# Patient Record
Sex: Female | Born: 1955 | Race: White | Hispanic: No | Marital: Married | Smoking: Former smoker
Health system: Southern US, Community
[De-identification: ages and names within clinical notes are randomized; demographics above are authoritative.]

## PROBLEM LIST (undated history)

## (undated) DIAGNOSIS — M1990 Unspecified osteoarthritis, unspecified site: Secondary | ICD-10-CM

## (undated) DIAGNOSIS — F32A Depression, unspecified: Secondary | ICD-10-CM

## (undated) DIAGNOSIS — IMO0002 Reserved for concepts with insufficient information to code with codable children: Secondary | ICD-10-CM

## (undated) DIAGNOSIS — F410 Panic disorder [episodic paroxysmal anxiety] without agoraphobia: Secondary | ICD-10-CM

## (undated) DIAGNOSIS — F329 Major depressive disorder, single episode, unspecified: Secondary | ICD-10-CM

## (undated) DIAGNOSIS — J449 Chronic obstructive pulmonary disease, unspecified: Secondary | ICD-10-CM

## (undated) HISTORY — DX: Unspecified osteoarthritis, unspecified site: M19.90

## (undated) HISTORY — DX: Depression, unspecified: F32.A

## (undated) HISTORY — DX: Reserved for concepts with insufficient information to code with codable children: IMO0002

## (undated) HISTORY — DX: Major depressive disorder, single episode, unspecified: F32.9

---

## 1988-10-06 HISTORY — PX: LUNG SURGERY: SHX703

## 1999-03-25 ENCOUNTER — Ambulatory Visit (HOSPITAL_COMMUNITY): Admission: RE | Admit: 1999-03-25 | Discharge: 1999-03-25 | Payer: Self-pay | Admitting: Obstetrics and Gynecology

## 1999-03-25 ENCOUNTER — Encounter: Payer: Self-pay | Admitting: Obstetrics and Gynecology

## 1999-04-11 ENCOUNTER — Ambulatory Visit (HOSPITAL_COMMUNITY): Admission: RE | Admit: 1999-04-11 | Discharge: 1999-04-11 | Payer: Self-pay | Admitting: Obstetrics and Gynecology

## 1999-04-11 ENCOUNTER — Encounter: Payer: Self-pay | Admitting: Obstetrics and Gynecology

## 2000-02-10 ENCOUNTER — Ambulatory Visit (HOSPITAL_COMMUNITY): Admission: RE | Admit: 2000-02-10 | Discharge: 2000-02-10 | Payer: Self-pay | Admitting: Family Medicine

## 2000-08-31 ENCOUNTER — Other Ambulatory Visit: Admission: RE | Admit: 2000-08-31 | Discharge: 2000-08-31 | Payer: Self-pay | Admitting: Obstetrics and Gynecology

## 2000-09-16 ENCOUNTER — Encounter: Admission: RE | Admit: 2000-09-16 | Discharge: 2000-09-16 | Payer: Self-pay | Admitting: General Surgery

## 2000-09-16 ENCOUNTER — Encounter: Payer: Self-pay | Admitting: Obstetrics and Gynecology

## 2001-10-04 ENCOUNTER — Other Ambulatory Visit: Admission: RE | Admit: 2001-10-04 | Discharge: 2001-10-04 | Payer: Self-pay | Admitting: Obstetrics and Gynecology

## 2002-07-28 ENCOUNTER — Encounter: Payer: Self-pay | Admitting: Family Medicine

## 2002-07-28 ENCOUNTER — Ambulatory Visit (HOSPITAL_COMMUNITY): Admission: RE | Admit: 2002-07-28 | Discharge: 2002-07-28 | Payer: Self-pay | Admitting: Family Medicine

## 2002-08-17 ENCOUNTER — Encounter: Payer: Self-pay | Admitting: Family Medicine

## 2002-08-17 ENCOUNTER — Encounter: Admission: RE | Admit: 2002-08-17 | Discharge: 2002-08-17 | Payer: Self-pay | Admitting: Family Medicine

## 2002-11-14 ENCOUNTER — Encounter: Payer: Self-pay | Admitting: Family Medicine

## 2002-11-14 ENCOUNTER — Ambulatory Visit (HOSPITAL_COMMUNITY): Admission: RE | Admit: 2002-11-14 | Discharge: 2002-11-14 | Payer: Self-pay | Admitting: Family Medicine

## 2002-11-20 ENCOUNTER — Emergency Department (HOSPITAL_COMMUNITY): Admission: EM | Admit: 2002-11-20 | Discharge: 2002-11-20 | Payer: Self-pay | Admitting: Emergency Medicine

## 2002-12-22 ENCOUNTER — Other Ambulatory Visit: Admission: RE | Admit: 2002-12-22 | Discharge: 2002-12-22 | Payer: Self-pay | Admitting: Obstetrics and Gynecology

## 2003-04-21 ENCOUNTER — Encounter: Payer: Self-pay | Admitting: Rheumatology

## 2003-04-21 ENCOUNTER — Encounter: Admission: RE | Admit: 2003-04-21 | Discharge: 2003-04-21 | Payer: Self-pay | Admitting: Rheumatology

## 2003-07-27 ENCOUNTER — Encounter: Payer: Self-pay | Admitting: Neurological Surgery

## 2003-07-28 ENCOUNTER — Encounter: Payer: Self-pay | Admitting: Neurological Surgery

## 2003-07-28 ENCOUNTER — Ambulatory Visit (HOSPITAL_COMMUNITY): Admission: RE | Admit: 2003-07-28 | Discharge: 2003-07-29 | Payer: Self-pay | Admitting: Neurological Surgery

## 2003-08-11 ENCOUNTER — Encounter (HOSPITAL_COMMUNITY): Admission: RE | Admit: 2003-08-11 | Discharge: 2003-09-10 | Payer: Self-pay | Admitting: Neurological Surgery

## 2003-08-21 ENCOUNTER — Encounter: Admission: RE | Admit: 2003-08-21 | Discharge: 2003-08-21 | Payer: Self-pay | Admitting: Neurological Surgery

## 2003-10-16 ENCOUNTER — Encounter: Admission: RE | Admit: 2003-10-16 | Discharge: 2003-10-16 | Payer: Self-pay | Admitting: Neurological Surgery

## 2003-11-07 ENCOUNTER — Encounter: Admission: RE | Admit: 2003-11-07 | Discharge: 2003-11-07 | Payer: Self-pay | Admitting: Neurological Surgery

## 2003-12-19 ENCOUNTER — Encounter: Admission: RE | Admit: 2003-12-19 | Discharge: 2003-12-19 | Payer: Self-pay | Admitting: Family Medicine

## 2003-12-26 ENCOUNTER — Other Ambulatory Visit: Admission: RE | Admit: 2003-12-26 | Discharge: 2003-12-26 | Payer: Self-pay | Admitting: Obstetrics and Gynecology

## 2004-01-08 ENCOUNTER — Encounter: Admission: RE | Admit: 2004-01-08 | Discharge: 2004-01-08 | Payer: Self-pay | Admitting: Family Medicine

## 2004-01-30 ENCOUNTER — Encounter: Admission: RE | Admit: 2004-01-30 | Discharge: 2004-01-30 | Payer: Self-pay | Admitting: Neurological Surgery

## 2004-05-08 ENCOUNTER — Emergency Department (HOSPITAL_COMMUNITY): Admission: EM | Admit: 2004-05-08 | Discharge: 2004-05-08 | Payer: Self-pay | Admitting: Emergency Medicine

## 2004-06-17 ENCOUNTER — Encounter (HOSPITAL_COMMUNITY): Admission: RE | Admit: 2004-06-17 | Discharge: 2004-07-05 | Payer: Self-pay | Admitting: Orthopedic Surgery

## 2004-06-21 ENCOUNTER — Ambulatory Visit (HOSPITAL_COMMUNITY): Admission: RE | Admit: 2004-06-21 | Discharge: 2004-06-21 | Payer: Self-pay | Admitting: General Surgery

## 2004-07-08 ENCOUNTER — Encounter (HOSPITAL_COMMUNITY): Admission: RE | Admit: 2004-07-08 | Discharge: 2004-08-07 | Payer: Self-pay | Admitting: Orthopedic Surgery

## 2004-08-14 ENCOUNTER — Emergency Department (HOSPITAL_COMMUNITY): Admission: EM | Admit: 2004-08-14 | Discharge: 2004-08-14 | Payer: Self-pay | Admitting: Emergency Medicine

## 2005-01-17 ENCOUNTER — Encounter: Admission: RE | Admit: 2005-01-17 | Discharge: 2005-01-17 | Payer: Self-pay | Admitting: Internal Medicine

## 2005-01-21 ENCOUNTER — Encounter: Admission: RE | Admit: 2005-01-21 | Discharge: 2005-01-21 | Payer: Self-pay | Admitting: Internal Medicine

## 2005-04-14 ENCOUNTER — Ambulatory Visit (HOSPITAL_COMMUNITY): Admission: RE | Admit: 2005-04-14 | Discharge: 2005-04-14 | Payer: Self-pay | Admitting: Internal Medicine

## 2005-04-18 ENCOUNTER — Other Ambulatory Visit: Admission: RE | Admit: 2005-04-18 | Discharge: 2005-04-18 | Payer: Self-pay | Admitting: Obstetrics and Gynecology

## 2005-05-12 ENCOUNTER — Encounter: Admission: RE | Admit: 2005-05-12 | Discharge: 2005-05-12 | Payer: Self-pay | Admitting: Family Medicine

## 2005-09-01 ENCOUNTER — Ambulatory Visit (HOSPITAL_COMMUNITY): Admission: RE | Admit: 2005-09-01 | Discharge: 2005-09-01 | Payer: Self-pay | Admitting: Internal Medicine

## 2005-10-06 HISTORY — PX: NECK SURGERY: SHX720

## 2006-01-20 ENCOUNTER — Ambulatory Visit (HOSPITAL_COMMUNITY): Admission: RE | Admit: 2006-01-20 | Discharge: 2006-01-20 | Payer: Self-pay | Admitting: Internal Medicine

## 2006-04-21 ENCOUNTER — Other Ambulatory Visit: Admission: RE | Admit: 2006-04-21 | Discharge: 2006-04-21 | Payer: Self-pay | Admitting: Obstetrics and Gynecology

## 2006-04-29 ENCOUNTER — Encounter: Admission: RE | Admit: 2006-04-29 | Discharge: 2006-04-29 | Payer: Self-pay | Admitting: Obstetrics and Gynecology

## 2006-12-01 ENCOUNTER — Ambulatory Visit: Payer: Self-pay | Admitting: Gastroenterology

## 2006-12-29 ENCOUNTER — Ambulatory Visit (HOSPITAL_COMMUNITY): Admission: RE | Admit: 2006-12-29 | Discharge: 2006-12-29 | Payer: Self-pay | Admitting: Gastroenterology

## 2006-12-29 ENCOUNTER — Ambulatory Visit: Payer: Self-pay | Admitting: Gastroenterology

## 2008-06-16 ENCOUNTER — Encounter: Admission: RE | Admit: 2008-06-16 | Discharge: 2008-06-16 | Payer: Self-pay | Admitting: Obstetrics and Gynecology

## 2009-11-28 ENCOUNTER — Ambulatory Visit (HOSPITAL_COMMUNITY): Admission: RE | Admit: 2009-11-28 | Discharge: 2009-11-28 | Payer: Self-pay | Admitting: Family Medicine

## 2010-10-26 ENCOUNTER — Encounter: Payer: Self-pay | Admitting: Internal Medicine

## 2010-10-27 ENCOUNTER — Encounter: Payer: Self-pay | Admitting: Obstetrics and Gynecology

## 2010-10-27 ENCOUNTER — Encounter: Payer: Self-pay | Admitting: Internal Medicine

## 2011-02-21 NOTE — H&P (Signed)
NAME:  Lindsey Morton, Lindsey Morton NO.:  [id number]   MEDICAL RECORD NO.:  [id number]                   PATIENT TYPE:  REC   LOCATION:  REH                                  FACILITY:  [facility]   PHYSICIAN:  Dalia Heading, M.D.               DATE OF BIRTH:  [birth year]   DATE OF ADMISSION:  06/17/2004  DATE OF DISCHARGE:                                HISTORY & PHYSICAL   CHIEF COMPLAINT:  Melena.   HISTORY OF PRESENT ILLNESS:  The patient is a 55 year old white female who  is referred for an EGD for melena. She has been having epigastric pain and  melena for several weeks. She was just started on treatment for helicobacter  pylori infection yesterday. She has taken Vioxx in the recent past but is  not currently taking any nonsteroidal anti-inflammatory medications. No  weight loss, nausea, diarrhea, constipation, hematochezia have been noted.  There is no history of hemorrhoidal disease.   PAST MEDICAL HISTORY:  As noted above.   PAST SURGICAL HISTORY:  Back surgery in 2004.   CURRENT MEDICATIONS:  Valium, Flagyl, doxycycline.   ALLERGIES:  PENICILLIN and CODEINE.   REVIEW OF SYSTEMS:  The patient does smoke a half pack of cigarettes a day.  She denies any alcohol use. She denies any other cardiopulmonary  difficulties or bleeding disorders.   PHYSICAL EXAMINATION:  GENERAL:  The patient is a well-developed, well-  nourished, white female in no acute distress. She is afebrile and vital  signs are stable.  LUNGS:  Clear to auscultation with equal breath sounds bilaterally.  HEART:  Reveals a regular rate and rhythm with S3, S4, or murmurs.  ABDOMEN:  Soft and nondistended. She was tender in the epigastric region. No  hepatosplenomegaly or masses are noted.  RECTAL:  Deferred to the procedure.   IMPRESSION:  Melena, epigastric pain.   PLAN:  The patient is scheduled for an EGD on June 21, 2004. The risks  and benefits of the procedure including bleeding  and perforation were fully  explained to the patient, who gave informed consent.     ___________________________________________                                         Dalia Heading, M.D.   MAJ/MEDQ  D:  06/20/2004  T:  06/20/2004  Job:  [id number]

## 2011-02-21 NOTE — Consult Note (Signed)
NAMENATAKI, MCCRUMB              ACCOUNT NO.:  [id number]   MEDICAL RECORD NO.:  [id number]           PATIENT TYPE:  AMB   LOCATION:  DAY                           FACILITY:  [facility]   PHYSICIAN:  Kassie Mends, M.D.      DATE OF BIRTH:  12-21-1955   DATE OF CONSULTATION:  12/01/2006  DATE OF DISCHARGE:                                 CONSULTATION   REQUESTING PHYSICIAN:  Dr. Sherwood Gambler.   CHIEF COMPLAINT:  Right-sided abdominal pain.   HISTORY OF PRESENT ILLNESS:  Ms. Lueras is a 55 year old Caucasian  female who tells me about 4 weeks ago she developed a shooting right-  sided abdominal pain.  The pain originates in the right lower quadrant  and radiates up to her right upper quadrant and through to her mid to  lower back.  The pain is intermittent.  It is 8/10 on the pain scale.  It usually lasts between minutes to an hour or so.  She does have  nausea.  She has vomited on one occasions.  She is complaining of  chronic constipation.  She can go up to 3 days without a bowel movement.  She denies any problems with diarrhea.  She has noticed some bright red  blood in small amounts on the toilet paper with straining.  Her weight  has been steady.  She tells me she has had a low grade fever.  She has  had a pretty extensive workup through Dr. Sharyon Medicus office.  She had an  abdominal pelvic CT with and without contrast on November 10, 2006  through [facility].  She was found to have mild intrahepatic and  extrahepatic bile duct dilatation with extrahepatic duct at 7 mm.  There  was prominent stool throughout the colon suggestive of constipation and  otherwise normal exam.  Ultrasound was suggested and performed on the  same date.  She was found to have cholesterosis of the gallbladder with  small polyps, no stones, no acute process and common duct distally is 7  mm maximally and normal tapering.  There was no pancreatic ductal  dilatation.  She was seen in the emergency room on November 29, 2006.  She had a right upper quadrant ultrasound.  The common bile duct was 6  mm.  There was no evidence of cholelithiasis or biliary dilatation on  that exam.  She had lab work while in the emergency room at [facility].  She had a basic metabolic panel which was normal.  She had a low a lipase of 17.  She had normal LFTs.  She had a CBC which  showed a white blood cell count 5.3, hemoglobin 14.7, hematocrit 42 and  platelet count of 279.  She had a urinalysis which had a low specific  gravity and trace blood and few bacteria, red cells and epithelial  cells.   PAST MEDICAL HISTORY:  COPD.  She has a history of H. pylori gastritis  status post treatment.  She has a history of chronic back pain with  bulging disks and osteoarthritis.  She is followed  by Dr. Vear Clock in  [place] [facility].  She has been on oxycodone.  She has  history of frequent cystitis, osteoporosis.  She had disk surgery in  2004, benign lung biopsy in '98 and foot surgery in 2003.   CURRENT MEDICATIONS:  1. Prempro.  2. Fosamax 70 mg weekly.  3. Glucosamine.  4. Oxycodone per pain clinic.   ALLERGIES:  CODEINE, PENICILLIN, BETADINE.   FAMILY HISTORY:  There is no known family history of colorectal  carcinoma, liver or chronic GI problems.   SOCIAL HISTORY:  Ms. Hillman is married.  She has one grown healthy son.  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  She has smoked a half a pack of cigarettes a day for about 20  years.  She denies any alcohol or drug use.   REVIEW OF SYSTEMS:  CONSTITUTIONAL:  Weight has been stable.  See HPI.  She has had some fatigue.  CARDIOVASCULAR:  Denies any chest pain or  palpitation.  PULMONARY:  Denies shortness of breath, dyspnea, cough,  cough, hemoptysis.  GASTROINTESTINAL:  See HPI.  GENITOURINARY:  Denies  any dysuria, hematuria, increased urinary frequency.   PHYSICAL EXAMINATION:  VITAL SIGNS:   Weight 118 pounds, height 67  inches, temperature 98.3, blood pressure 90/72, pulse 80.  GENERAL:  Ms. Haigh is a thin Caucasian female who is alert and  pleasant, cooperative in no acute distress.  HEENT:  Sclerae clear, nonicteric.  Conjunctivae:  Pink.  Oropharynx  pink and moist without lesions.  NECK:  Supple without mass or thyromegaly.  CHEST:  Heart regular rate and rhythm. Normal S1 and S2 without murmurs,  clicks, rubs or gallops.  LUNGS:  Clear to auscultation bilaterally.  ABDOMEN:  Positive bowel sounds x4.  No bruits auscultated.  Soft,  nondistended.  She has mild tenderness in the right lower quadrant on  deep palpation.  There is no rebound tenderness or guarding.  No  hepatosplenomegaly or mass.  There is no evidence of abdominal wall  pain.  No rebound tenderness or guarding.  EXTREMITIES:  Without clubbing or edema bilaterally.  SKIN:  Pink, warm and dry without any rash or jaundice.   IMPRESSION:  Ms. Helder is a 55 year-old Caucasian female with chronic  right-sided abdominal pain, extensive workup thus far including lab work  and abdominal ultrasound and CT scan show constipation which could be  the culprit of her pain given chronic narcotic use.  She was also found  to have a very mild extrahepatic biliary ductal dilatation that was seen  on the one of the three imaging studies that she has had done.  I  reviewed this with Dr. Cira Servant, and this does not appear to be clinically  significant at this time.  She has had small volume hematochezia as well  as going to need colonoscopy for further evaluation to rule out  colorectal carcinoma.   PLAN:  Colonoscopy with Dr. Cira Servant in the near future.  I discussed the  procedure including risks and benefits which include but are not limited  to bleeding, infection, perforation, drug reaction.  She agrees with the  plan, and consent will be obtained.  We would like to thank Dr. Sherwood Gambler for allowing [place] to participate in  the  care of Ms. Migliaccio.      Nicholas Lose, N.P.      Kassie Mends, M.D.  Electronically Signed    KC/MEDQ  D:  12/02/2006  T:  12/02/2006  Job:  [id number]   cc:   Madelin Rear. Sherwood Gambler, MD  Fax: [phone]

## 2011-02-21 NOTE — Op Note (Signed)
NAMEDARIENNE, BELLEAU              ACCOUNT NO.:  [id number]   MEDICAL RECORD NO.:  [id number]          PATIENT TYPE:  AMB   LOCATION:  DAY                           FACILITY:  [facility]   PHYSICIAN:  Kassie Mends, M.D.      DATE OF BIRTH:  03/11/1956   DATE OF PROCEDURE:  12/29/2006  DATE OF DISCHARGE:                               OPERATIVE REPORT   PROCEDURE:  Sigmoidoscopy.   INDICATIONS FOR PROCEDURE:  Ms. Mainer is a 55 year old female with  chronic right lower quadrant pain.  She is chronically maintained on  oxycodone for back and neck pain.  She also takes Xanax.   FINDINGS:  1. Ms. Gunderson was difficult to sedate.  She required high doses of      Demerol and Versed.  She could not tolerate an adult colonoscope      passing through her sigmoid colon.  She was switched to a pediatric      colonoscope.  The pediatric colonoscope was passed into the mid-      transverse colon.  Due to looping in the sigmoid colon, she had      significant discomfort and requested that the procedure be      discontinued.  She had been given the maximum dose of Demerol and      Versed that could be given during the procedure.  2. Normal limited colonoscopy to the mid-transverse colon without      evidence of polyps, masses, diverticula, inflammatory changes or      AVMs.  3. Normal retroflexed view of the rectum.   RECOMMENDATIONS:  1. Schedule colonoscopy within the next month to be performed with      propofol to provide adequate sedation.  Ms. Wanner requires      propofol because she could not be adequately sedated with conscious      sedation using Demerol, Versed, and Phenergan.  2. High fiber diet.  Ms. Howerton is given a handout on high-fiber diet      and constipation.  3. Follow up with Dr. Cira Servant in eight weeks to discuss constipation and      abdominal pain.   MEDICATIONS:  1. Demerol 125 mg IV.  2. Versed 12 mg IV.  3. Phenergan 50 mg IV.   PROCEDURE TECHNIQUE:  The exam was  performed, and informed consent was  obtained from the patient after explaining benefits, risks and  alternatives to the procedure.  The patient was connected to the monitor  and placed in left lateral position.  Continuous oxygen was provided by  nasal cannula and IV medicine administered through an indwelling  cannula.  After administration of sedation and rectal exam, the  patient's rectum was intubated, and the scope was passed under direct  visualization to the mid-transverse colon.  During the procedure, the  adult  colonoscope was withdrawn and the pediatric colonoscope was used.  The  scope was subsequently withdrawn, while carefully examining the color,  texture, anatomy, and integrity of the mucosa on the way out.  The  patient was recovered in the endoscopy suite and discharged  to home in  satisfactory condition.      Kassie Mends, M.D.  Electronically Signed     SM/MEDQ  D:  12/29/2006  T:  12/29/2006  Job:  [id number]   cc:   Madelin Rear. Sherwood Gambler, MD  Fax: [phone]

## 2011-02-21 NOTE — Op Note (Signed)
NAME:  Lindsey Morton, Lindsey Morton                        ACCOUNT NO.:  [id number]   MEDICAL RECORD NO.:  [id number]                   PATIENT TYPE:  OIB   LOCATION:  NA                                   FACILITY:  [facility]   PHYSICIAN:  Tia Alert, MD                  DATE OF BIRTH:  04-26-1956   DATE OF PROCEDURE:  07/28/2003  DATE OF DISCHARGE:                                 OPERATIVE REPORT   PREOPERATIVE DIAGNOSIS:  Cervical spondylosis with stenosis, C4-C5 and C5-  C6, with left arm pain.   POSTOPERATIVE DIAGNOSIS:  Cervical spondylosis with stenosis, C4-C5 and C5-  C6, with left arm pain.   PROCEDURE:  1. Decompressive anterior cervical discectomy C4-C5 and C5-C6 for spinal     cord and nerve root decompression.  2. Anterior cervical arthrodesis C4-C5 and C5-C6 utilizing 6 mm fibular     allograft.  3. Anterior cervical plating C4 to C6 inclusive utilizing a 40 mm Atlantis     Vision plate.   SURGEON:  Tia Alert, MD   ASSISTANT:  Lindsey Morton, M.D.   ANESTHESIA:  General endotracheal anesthesia.   COMPLICATIONS:  None apparent.   INDICATIONS FOR PROCEDURE:  Lindsey Morton is a 55 year old white female who  was referred to the neurosurgery clinic with complaints of neck pain with  severe left arm pain with numbness in her hand.  She had an MRI which showed  severe spinal stenosis at C4-C5 and C5-C6 with spinal cord compression.  I  recommended a decompressive anterior cervical discectomy and fusion with  plating at C4-C5 and C5-C6.  She understood the risks, the benefits, and the  alternatives and wished to proceed.   DESCRIPTION OF PROCEDURE:  The patient was taken to the operating room  where, after induction of adequate general endotracheal anesthesia, she was  placed in the supine position on the operating table where her right  anterior cervical region was prepped with DuraPrep and was draped in the  usual sterile fashion.  4 mL of local anesthesia was injected and  then an  incision was made to the right of midline and carried down to the platysma.  The platysma was opened, elevated, and undermined with Metzenbaum scissors.  I then dissected in a plane between the sternocleidomastoid muscle and  internal carotid artery and the trachea and esophagus to expose the anterior  cervical spine at C4-C5 and C5-C6.  Interoperative fluoroscopy confirmed our  levels and then the longus colli muscles were taken down bilaterally to  expose the anterior cervical spine of C4-C5 and C5-C6.  The disc spaces were  incised with a 15 blade scalpel.  She had very collapsed, degenerated discs.  We placed disc space distractors into the bodies of C5 and C6 and distracted  the disc space slightly and then used the [facility] Max air powered high speed  drill to drill the endplates  and drilled down to the level of the posterior  longitudinal ligament.  The operating microscope was brought onto the field  and the remainder of the procedure was done under the operating microscope.   The posterior longitudinal ligament was opened and the posterior osteophytes  along with the posterior longitudinal ligament was removed circumferentially  at C5-C6 and foraminotomies were performed bilaterally.  The decompression  looked good.  We dried bleeding with Gelfoam and then measured the disc  space to be 6 mm.  We tapped a 6 mm fibular allograft into the interspace of  C5-C6 and then removed the distraction and the distraction pin from C6 and  waxed the hole.  We then placed the distraction pin in the body of C4 and  distracted the disc space slightly and once again used the drill to drill  the endplates for arthrodesis and then drilled down to the level of the  posterior longitudinal ligament which was then opened with a nerve hook and  removed circumferentially along  with the posterior osteophytes with a  Kerrison punch.  We performed bilateral foraminotomies and then palpated in  each  foraminotomy to assure adequate decompression of the nerve roots.  The  nerve roots were visualized.  We then dried the decompression bed once again  and placed another 6 mm fibular allograft into the interspace at C4-C5 and  then removed the distraction pins from the bodies of C4 and C5 and waxed the  holes once again.  We then brought in a 40 mm [place] Vision plate and  placed two 13 mm fixed angle screws into the body of C6 and two 13 mm  variable angle screws into the bodies of C4 and C5 respectively, and then  tightened these into position and locked them into position with the top  locking mechanism.  We then copiously irrigated with Bacitracin containing  saline solution and dried all bleeding points with bipolar cautery.  We  achieved meticulous hemostasis.  We then closed the platysma with  interrupted 3-0 Vicryl, closed the subcuticular tissue with 3-0 Vicryl, and  closed the skin with Benzoin and Steri-Strips.  The drapes were removed.  A  sterile dressing was applied.  The patient was awakened from general  anesthesia and transferred to the recovery room in stable condition.  At the  end of the procedure, all sponge, needle, and instrument counts were  correct.                                               Tia Alert, MD    DSJ/MEDQ  D:  07/28/2003  T:  07/28/2003  Job:  [id number]

## 2011-07-11 ENCOUNTER — Emergency Department (HOSPITAL_COMMUNITY)
Admission: EM | Admit: 2011-07-11 | Discharge: 2011-07-11 | Disposition: A | Discharge status: Home / Self Care | Payer: Medicare Other | Attending: Emergency Medicine | Admitting: Emergency Medicine

## 2011-07-11 ENCOUNTER — Emergency Department (HOSPITAL_COMMUNITY): Payer: Medicare Other

## 2011-07-11 ENCOUNTER — Encounter (HOSPITAL_COMMUNITY): Payer: Self-pay | Admitting: Radiology

## 2011-07-11 DIAGNOSIS — R42 Dizziness and giddiness: Secondary | ICD-10-CM | POA: Insufficient documentation

## 2011-07-11 DIAGNOSIS — R5381 Other malaise: Secondary | ICD-10-CM | POA: Insufficient documentation

## 2011-07-11 DIAGNOSIS — R4789 Other speech disturbances: Secondary | ICD-10-CM | POA: Insufficient documentation

## 2011-07-11 DIAGNOSIS — R5383 Other fatigue: Secondary | ICD-10-CM | POA: Insufficient documentation

## 2011-07-11 DIAGNOSIS — J449 Chronic obstructive pulmonary disease, unspecified: Secondary | ICD-10-CM | POA: Insufficient documentation

## 2011-07-11 DIAGNOSIS — R51 Headache: Secondary | ICD-10-CM | POA: Insufficient documentation

## 2011-07-11 DIAGNOSIS — J4489 Other specified chronic obstructive pulmonary disease: Secondary | ICD-10-CM | POA: Insufficient documentation

## 2011-07-11 HISTORY — DX: Chronic obstructive pulmonary disease, unspecified: J44.9

## 2011-07-11 LAB — BASIC METABOLIC PANEL
GFR calc Af Amer: >90 mL/min (ref >90)
GFR calc non Af Amer: >90 mL/min (ref >90)
Glucose, Bld: 75 mg/dL (ref 70–99)
Potassium: 3.6 mEq/L (ref 3.5–5.1)
Sodium: 139 mEq/L (ref 135–145)

## 2011-07-11 LAB — LIPID PANEL
HDL: 39 mg/dL — ABNORMAL LOW (ref >39)
LDL Cholesterol: 46 mg/dL (ref 0–99)
Triglycerides: 69 mg/dL (ref <150)

## 2011-07-11 LAB — CBC
Hemoglobin: 11.5 g/dL — ABNORMAL LOW (ref 12.0–15.0)
MCHC: 33.6 g/dL (ref 30.0–36.0)
RBC: 3.54 MIL/uL — ABNORMAL LOW (ref 3.87–5.11)

## 2011-08-29 ENCOUNTER — Encounter: Payer: Self-pay | Admitting: *Deleted

## 2011-09-01 ENCOUNTER — Ambulatory Visit: Payer: Medicare Other | Admitting: Cardiovascular Disease

## 2011-09-22 ENCOUNTER — Encounter: Payer: Self-pay | Admitting: Cardiovascular Disease

## 2011-09-22 ENCOUNTER — Ambulatory Visit (INDEPENDENT_AMBULATORY_CARE_PROVIDER_SITE_OTHER): Payer: Medicare Other | Admitting: Cardiovascular Disease

## 2011-09-22 ENCOUNTER — Telehealth: Payer: Self-pay | Admitting: *Deleted

## 2011-09-22 DIAGNOSIS — R55 Syncope and collapse: Secondary | ICD-10-CM

## 2011-09-22 DIAGNOSIS — R634 Abnormal weight loss: Secondary | ICD-10-CM

## 2011-09-22 LAB — BASIC METABOLIC PANEL
BUN: 6 mg/dL (ref 6–23)
CO2: 29 mEq/L (ref 19–32)
Chloride: 103 mEq/L (ref 96–112)
Creatinine, Ser: 0.6 mg/dL (ref 0.4–1.2)
Glucose, Bld: 81 mg/dL (ref 70–99)

## 2011-09-22 NOTE — Patient Instructions (Signed)
Your physician recommends that you schedule a follow-up appointment in: AS NEEDED BASIS   Your physician recommends that you return for lab work in: TODAY, TSH, BMP   Your physician has requested that you have an echocardiogram. Echocardiography is a painless test that uses sound waves to create images of your heart. It provides your doctor with information about the size and shape of your heart and how well your heart's chambers and valves are working. This procedure takes approximately one hour. There are no restrictions for this procedure.

## 2011-09-22 NOTE — Assessment & Plan Note (Signed)
The patient is quite cachectic. She appears to be someone that may have a yet to be diagnosed cancer. I could not elicit any specific complaints other than her TIA and syncope. She denies any significant weight loss. She does drink Ensure or boost to try to gain weight. I've encouraged her to gain some weight if possible. I've encouraged her to get a new medical Dr. for further evaluation. I've ordered a TSH I don't think that was drawn because the patient left the office without checking out.

## 2011-09-22 NOTE — Progress Notes (Signed)
Lindsey Morton Date of Birth  Jul 04, 1956 [facility] [address]    [address] [place], [place]  [address] [phone]  Fax  [phone]  History of Present Illness:  Lindsey Morton is a 55 yo female with a hx of COPD, TIAs,  and black out spells. She does not have any cardiac history. No history of cancer. She admits to having lost about 6 or 7 pounds in the past several months. She's always been quite thin. She does lots of housework.  She does not get any regular exercise.   She does not have a general medical Dr.  She has been seen by neurology for several episodes of near syncope. These episodes are described as a sudden weakness with near loss of consciousness. She's never had any complete loss of consciousness. She denies any palpitations prior to these episodes. She never has any chest pain. She does have chronic shortness of breath related to her COPD.  Current Outpatient Prescriptions on File Prior to Visit  Medication Sig Dispense Refill  . albuterol (PROVENTIL) (2.5 MG/3ML) 0.083% nebulizer solution Take 2.5 mg by nebulization every 6 (six) hours as needed.        . ALPRAZolam (XANAX) 1 MG tablet Take 1 mg by mouth 3 (three) times daily.        . Calcium-Magnesium-Vitamin D (CALCIUM MAGNESIUM PO) Take by mouth daily.        . clopidogrel (PLAVIX) 75 MG tablet Take 75 mg by mouth daily.        . Multiple Vitamin (MULTIVITAMIN) tablet Take 1 tablet by mouth daily.          Allergies  Allergen Reactions  . Bactrim   . Betadine (Povidone Iodine)   . Codeine   . Penicillins     Past Medical History  Diagnosis Date  . COPD (chronic obstructive pulmonary disease)   . Osteoporosis   . Osteoarthritis   . DDD (degenerative disc disease)   . Asthma   . Arthritis   . Depression   . Emphysema     Past Surgical History  Procedure Date  . Neck surgery 2007  . Lung surgery 1990    History  Smoking status  . Current Everyday Smoker -- 0.5 packs/day  Smokeless  tobacco  . Not on file    History  Alcohol Use No    Family History  Problem Relation Age of Onset  . COPD      Reviw of Systems:  Reviewed in the HPI.  All other systems are negative.  Physical Exam: BP 90/68  Pulse 84  Ht 5\' 7"  (1.702 m)  Wt 90 lb 6.4 oz (41.005 kg)  BMI 14.16 kg/m2 [name] is quite frail and cachectic. The patient is alert and oriented x 3.  The mood and affect are normal.   Skin: warm and dry.  She appears to be fairly pale. She wore a hat during her office visit and it appears that her hair is very thin.  HEENT:   Normocephalic/atraumatic. She has normal carotids. There is no JVD.  Lungs: Lungs are clear.   Heart: Regular rate S1-S2. Her chest wall is very thin and so it is somewhat difficult to hear her heart sounds.    Abdomen: Her abdomen is quite thin. There is no hepatosplenomegaly. She has good bowel sounds. There is no areas of tenderness.  Extremities:  Her arms or legs have very little muscle mass. There is no edema  Neuro:  Her gait is normal    ECG: EKG from October from her medical Dr. reveals normal sinus rhythm. There is no arrhythmias to  Assessment / Plan:

## 2011-09-22 NOTE — Telephone Encounter (Signed)
Patient was seen by Dr. Elease Hashimoto today. Dr. Elease Hashimoto ordered labs/echocardiogram. Patient has decided not to schedule echo at this time. She doesn't feel as thought she needs an echo. If she changes her mind, she will call back.

## 2011-09-22 NOTE — Assessment & Plan Note (Signed)
Visit is having spells of near syncope. These episodes are consistent with TIAs. I do not think that there is a cardiac etiology. She apparently has had carotid Dopplers at the neurologist office which according to her were normal. I do not have those records on my computer.  I doubt that these episodes are due to an arrhythmia. She has a normal resting EKG. I would like to do an echocardiogram for further evaluation.  In talking to the patient she became a little annoyed at the fact that she was at the cardiologist's office. She apparently did not know that she was going to see a cardiologist today. She does not think that she has any heart problems. She walked out of the office without the echocardiogram getting scheduled.  We'll see her on an as-needed basis.

## 2012-08-16 ENCOUNTER — Emergency Department (HOSPITAL_COMMUNITY): Payer: PRIVATE HEALTH INSURANCE

## 2012-08-16 ENCOUNTER — Emergency Department (HOSPITAL_COMMUNITY)
Admission: EM | Admit: 2012-08-16 | Discharge: 2012-08-16 | Disposition: A | Discharge status: Home / Self Care | Payer: PRIVATE HEALTH INSURANCE | Attending: Emergency Medicine | Admitting: Emergency Medicine

## 2012-08-16 ENCOUNTER — Encounter (HOSPITAL_COMMUNITY): Payer: Self-pay

## 2012-08-16 DIAGNOSIS — R200 Anesthesia of skin: Secondary | ICD-10-CM

## 2012-08-16 DIAGNOSIS — J4489 Other specified chronic obstructive pulmonary disease: Secondary | ICD-10-CM | POA: Insufficient documentation

## 2012-08-16 DIAGNOSIS — J438 Other emphysema: Secondary | ICD-10-CM | POA: Insufficient documentation

## 2012-08-16 DIAGNOSIS — R209 Unspecified disturbances of skin sensation: Secondary | ICD-10-CM | POA: Insufficient documentation

## 2012-08-16 DIAGNOSIS — J449 Chronic obstructive pulmonary disease, unspecified: Secondary | ICD-10-CM | POA: Insufficient documentation

## 2012-08-16 DIAGNOSIS — J45909 Unspecified asthma, uncomplicated: Secondary | ICD-10-CM | POA: Insufficient documentation

## 2012-08-16 DIAGNOSIS — F329 Major depressive disorder, single episode, unspecified: Secondary | ICD-10-CM | POA: Insufficient documentation

## 2012-08-16 DIAGNOSIS — Z7982 Long term (current) use of aspirin: Secondary | ICD-10-CM | POA: Insufficient documentation

## 2012-08-16 DIAGNOSIS — Z79899 Other long term (current) drug therapy: Secondary | ICD-10-CM | POA: Insufficient documentation

## 2012-08-16 DIAGNOSIS — F3289 Other specified depressive episodes: Secondary | ICD-10-CM | POA: Insufficient documentation

## 2012-08-16 DIAGNOSIS — F17200 Nicotine dependence, unspecified, uncomplicated: Secondary | ICD-10-CM | POA: Insufficient documentation

## 2012-08-16 HISTORY — DX: Panic disorder (episodic paroxysmal anxiety): F41.0

## 2012-08-16 MED ORDER — PROMETHAZINE HCL 25 MG PO TABS: 25.0000 mg | ORAL_TABLET | Freq: Four times a day (QID) | ORAL | Status: DC | PRN

## 2012-08-16 MED ORDER — OXYCODONE-ACETAMINOPHEN 5-325 MG PO TABS: 1.0000 | ORAL_TABLET | Freq: Four times a day (QID) | ORAL | Status: DC | PRN

## 2012-08-16 MED ORDER — PREDNISONE 20 MG PO TABS: 60.0000 mg | ORAL_TABLET | Freq: Every day | ORAL | Status: DC

## 2012-08-16 NOTE — ED Notes (Signed)
While walking down hallway, pt stated she has been unable to use her left hand since Friday, she was tearful, stated she was scared and in pain.

## 2012-08-16 NOTE — ED Notes (Signed)
Spoke with Dr. Yetta Barre office to set up appt.  [place] [facility] Will pull up chart, let doctor review it and call patient with an appointment.  Crystal and Dr. Adriana Simas inforned.

## 2012-08-16 NOTE — ED Provider Notes (Signed)
History   This chart was scribed for Donnetta Hutching, MD by Charolett Bumpers, ER Scribe. The patient was seen in room APA05/APA05. Patient's care was started at 1249.   CSN: [id number]  Arrival date & time 08/16/12  1225   First MD Initiated Contact with Patient 08/16/12 1249      Chief Complaint  Patient presents with  . Numbness    left arm     The history is provided by the patient. No language interpreter was used.   Lindsey Morton is a 56 y.o. female who presents to the Emergency Department complaining of constant, severe left hand numbness with an onset of 3 days ago. She states she is unable to move her left hand at all, but has full ROM of left elbow and shoulder. She reports associated shooting pain from left thumb that radiates up her left arm. She reports a h/o DDD with a neck surgery where plates were placed back in 2007. She states the plates are currently in place. She states she got a CT scan a month ago in [place].   Past Medical History  Diagnosis Date  . COPD (chronic obstructive pulmonary disease)   . Osteoporosis   . Osteoarthritis   . DDD (degenerative disc disease)   . Asthma   . Arthritis   . Depression   . Emphysema   . DDD (degenerative disc disease)   . Panic attack     Past Surgical History  Procedure Date  . Neck surgery 2007  . Lung surgery 1990    Family History  Problem Relation Age of Onset  . COPD      History  Substance Use Topics  . Smoking status: Current Every Day Smoker -- 0.5 packs/day    Types: Cigarettes  . Smokeless tobacco: Not on file  . Alcohol Use: No    OB History    Grav Para Term Preterm Abortions TAB SAB Ect Mult Living                  Review of Systems A complete 10 system review of systems was obtained and all systems are negative except as noted in the HPI and PMH.   Allergies  Bactrim; Betadine; Codeine; and Penicillins  Home Medications   Current Outpatient Rx  Name  Route  Sig   Dispense  Refill  . ALBUTEROL SULFATE HFA 108 (90 BASE) MCG/ACT IN AERS   Inhalation   Inhale 2 puffs into the lungs every 6 (six) hours as needed. Shortness of Breath         . ALBUTEROL SULFATE (2.5 MG/3ML) 0.083% IN NEBU   Nebulization   Take 2.5 mg by nebulization every 6 (six) hours as needed. Shortness of Breath         . ALPRAZOLAM 1 MG PO TABS   Oral   Take 1 mg by mouth 3 (three) times daily.           . ASPIRIN 81 MG PO TABS   Oral   Take 81 mg by mouth daily.           Marland Kitchen CALCIUM MAGNESIUM PO   Oral   Take by mouth daily.           Marland Kitchen FLUTICASONE-SALMETEROL 100-50 MCG/DOSE IN AEPB   Inhalation   Inhale 1 puff into the lungs every 12 (twelve) hours.         Marland Kitchen ONE-DAILY MULTI VITAMINS PO TABS   Oral  Take 1 tablet by mouth daily.             BP 131/103  Pulse 95  Temp 97.9 F (36.6 C) (Oral)  Resp 22  Ht 5\' 8"  (1.727 m)  Wt 100 lb (45.36 kg)  BMI 15.21 kg/m2  SpO2 100%  Physical Exam  Nursing note and vitals reviewed. Constitutional: She is oriented to person, place, and time. She appears well-developed and well-nourished.  HENT:  Head: Normocephalic and atraumatic.  Eyes: Conjunctivae normal and EOM are normal. Pupils are equal, round, and reactive to light.  Neck: Normal range of motion. Neck supple.  Cardiovascular: Normal rate, regular rhythm and normal heart sounds.   Pulmonary/Chest: Effort normal and breath sounds normal. No respiratory distress. She has no wheezes.  Abdominal: Soft. Bowel sounds are normal.  Musculoskeletal: Normal range of motion. She exhibits no edema and no tenderness.       On left hand, all digits in flexion and unable to extend. Subjective numbness to touch. Full ROM intact at elbow and shoulder.   Neurological: She is alert and oriented to person, place, and time.  Skin: Skin is warm and dry.  Psychiatric: She has a normal mood and affect.    ED Course  Procedures (including critical care  time)  DIAGNOSTIC STUDIES: Oxygen Saturation is 100% on room air, normal by my interpretation.    COORDINATION OF CARE:  13:05-Discussed planned course of treatment with the patient including a CT of cervical neck, who is agreeable at this time.     Labs Reviewed - No data to display Ct Cervical Spine Wo Contrast  08/16/2012  **ADDENDUM** CREATED: 08/16/2012 13:50:28  Lung apices incompletely evaluated.  Soft tissue density right lung apex with surgical clip.  Severe COPD left apex.  Recommend two- view chest x-ray for further evaluation, and comparison with prior CXR  from 11/28/2009.  **END ADDENDUM** SIGNED BY: Elsie Stain, M.D.   08/16/2012  *RADIOLOGY REPORT*  Clinical Data: Difficulty moving left hand for approximately 3 days.  Left upper extremity numbness.  No injury.  CT CERVICAL SPINE WITHOUT CONTRAST  Technique:  Multidetector CT imaging of the cervical spine was performed. Multiplanar CT image reconstructions were also generated.  Comparison: Outside films from 04/14/2005.  Findings: The patient has undergone previous C4-C6 ACDF with anterior plating.  The fusion appears solid.  There has been severe interval disc space narrowing at C3-C4 since the previous plain films in 2006.  This likely represents adjacent segment of these.  There is advanced osteophyte formation and uncinate spurring central and to the left which narrows the foramen.  The right neural foramen is slightly narrowed but not to the degree of the left.  Bilateral C4 nerve root encroachment is likely, much worse on the left. Mild to moderate central canal stenosis with possible cord compression.  Canal diameter 6 mm.  The C6-C7 level shows good preservation of interspace height. There appears be mild annular bulging.  At C7-T1, there is mild facet arthropathy.  IMPRESSION: Solid fusion C4-C6.  Adjacent segment disease C3-C4 with severe disc space narrowing and bony overgrowth; there is significant foraminal narrowing on  the left along with mild to moderate spinal stenosis both of which likely result in neural impingement.  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  This is progressive from 2006.   Original Report Authenticated By: Davonna Belling, M.D.      No diagnosis found.   Date: 08/16/2012  Rate: 91  Rhythm: normal sinus rhythm  QRS Axis: normal  Intervals: normal  ST/T Wave abnormalities: normal  Conduction Disutrbances:none  Narrative Interpretation:   Old EKG Reviewed: changes noted PAC  MDM  CT cervical spine abnormal. Results reviewed by examiner. Dr. Marikay Alar will review the CT findings and followup with patient for recommendations. Prescription for prednisone 60 mg for 5 days, Percocet #20 and Phenergan 25 mg #20    I personally performed the services described in this documentation, which was scribed in my presence. The recorded information has been reviewed and is accurate.      Donnetta Hutching, MD 08/16/12 1558
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
## 2015-01-13 ENCOUNTER — Encounter (HOSPITAL_COMMUNITY): Payer: Self-pay | Admitting: Emergency Medicine

## 2015-01-13 ENCOUNTER — Emergency Department (HOSPITAL_COMMUNITY)
Admission: EM | Admit: 2015-01-13 | Discharge: 2015-01-13 | Disposition: A | Discharge status: Home / Self Care | Payer: No Typology Code available for payment source | Attending: Emergency Medicine | Admitting: Emergency Medicine

## 2015-01-13 ENCOUNTER — Emergency Department (HOSPITAL_COMMUNITY): Payer: No Typology Code available for payment source

## 2015-01-13 DIAGNOSIS — Y9389 Activity, other specified: Secondary | ICD-10-CM | POA: Diagnosis not present

## 2015-01-13 DIAGNOSIS — S79911A Unspecified injury of right hip, initial encounter: Secondary | ICD-10-CM | POA: Diagnosis present

## 2015-01-13 DIAGNOSIS — Z880 Allergy status to penicillin: Secondary | ICD-10-CM | POA: Diagnosis not present

## 2015-01-13 DIAGNOSIS — F329 Major depressive disorder, single episode, unspecified: Secondary | ICD-10-CM | POA: Insufficient documentation

## 2015-01-13 DIAGNOSIS — Z7982 Long term (current) use of aspirin: Secondary | ICD-10-CM | POA: Insufficient documentation

## 2015-01-13 DIAGNOSIS — M25551 Pain in right hip: Secondary | ICD-10-CM

## 2015-01-13 DIAGNOSIS — S3992XA Unspecified injury of lower back, initial encounter: Secondary | ICD-10-CM | POA: Diagnosis not present

## 2015-01-13 DIAGNOSIS — Y92410 Unspecified street and highway as the place of occurrence of the external cause: Secondary | ICD-10-CM | POA: Insufficient documentation

## 2015-01-13 DIAGNOSIS — Y998 Other external cause status: Secondary | ICD-10-CM | POA: Diagnosis not present

## 2015-01-13 DIAGNOSIS — R0602 Shortness of breath: Secondary | ICD-10-CM | POA: Insufficient documentation

## 2015-01-13 DIAGNOSIS — M1990 Unspecified osteoarthritis, unspecified site: Secondary | ICD-10-CM | POA: Insufficient documentation

## 2015-01-13 DIAGNOSIS — J449 Chronic obstructive pulmonary disease, unspecified: Secondary | ICD-10-CM | POA: Insufficient documentation

## 2015-01-13 DIAGNOSIS — Z7952 Long term (current) use of systemic steroids: Secondary | ICD-10-CM | POA: Insufficient documentation

## 2015-01-13 DIAGNOSIS — F410 Panic disorder [episodic paroxysmal anxiety] without agoraphobia: Secondary | ICD-10-CM | POA: Insufficient documentation

## 2015-01-13 MED ORDER — OXYCODONE-ACETAMINOPHEN 5-325 MG PO TABS
1.0000 | ORAL_TABLET | Freq: Four times a day (QID) | ORAL | Status: DC | PRN
Start: 2015-01-13 — End: 2015-01-13

## 2015-01-13 MED ORDER — BACLOFEN 10 MG PO TABS: 10.0000 mg | ORAL_TABLET | Freq: Three times a day (TID) | ORAL | Status: AC

## 2015-01-13 MED ORDER — BACLOFEN 10 MG PO TABS
10.0000 mg | ORAL_TABLET | Freq: Three times a day (TID) | ORAL | Status: DC
Start: 2015-01-13 — End: 2015-01-13

## 2015-01-13 MED ORDER — ONDANSETRON HCL 4 MG PO TABS
4.0000 mg | ORAL_TABLET | Freq: Once | ORAL | Status: AC
  Administered 2015-01-13: 4 mg via ORAL
  Filled 2015-01-13: qty 1

## 2015-01-13 MED ORDER — OXYCODONE-ACETAMINOPHEN 5-325 MG PO TABS: 1.0000 | ORAL_TABLET | Freq: Four times a day (QID) | ORAL | Status: DC | PRN

## 2015-01-13 MED ORDER — OXYCODONE-ACETAMINOPHEN 5-325 MG PO TABS
1.0000 | ORAL_TABLET | Freq: Once | ORAL | Status: AC
  Administered 2015-01-13: 1 via ORAL
  Filled 2015-01-13: qty 1
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
## 2015-04-12 ENCOUNTER — Emergency Department (HOSPITAL_COMMUNITY)
Admission: EM | Admit: 2015-04-12 | Discharge: 2015-04-12 | Disposition: A | Discharge status: Home / Self Care | Payer: No Typology Code available for payment source | Attending: Emergency Medicine | Admitting: Emergency Medicine

## 2015-04-12 ENCOUNTER — Encounter (HOSPITAL_COMMUNITY): Payer: Self-pay | Admitting: Emergency Medicine

## 2015-04-12 ENCOUNTER — Emergency Department (HOSPITAL_COMMUNITY): Payer: No Typology Code available for payment source

## 2015-04-12 DIAGNOSIS — Z720 Tobacco use: Secondary | ICD-10-CM | POA: Diagnosis not present

## 2015-04-12 DIAGNOSIS — J189 Pneumonia, unspecified organism: Secondary | ICD-10-CM

## 2015-04-12 DIAGNOSIS — M1990 Unspecified osteoarthritis, unspecified site: Secondary | ICD-10-CM | POA: Diagnosis not present

## 2015-04-12 DIAGNOSIS — Z79899 Other long term (current) drug therapy: Secondary | ICD-10-CM | POA: Insufficient documentation

## 2015-04-12 DIAGNOSIS — J441 Chronic obstructive pulmonary disease with (acute) exacerbation: Secondary | ICD-10-CM | POA: Diagnosis not present

## 2015-04-12 DIAGNOSIS — Z7982 Long term (current) use of aspirin: Secondary | ICD-10-CM | POA: Insufficient documentation

## 2015-04-12 DIAGNOSIS — R509 Fever, unspecified: Secondary | ICD-10-CM | POA: Diagnosis present

## 2015-04-12 DIAGNOSIS — J159 Unspecified bacterial pneumonia: Secondary | ICD-10-CM | POA: Insufficient documentation

## 2015-04-12 DIAGNOSIS — F329 Major depressive disorder, single episode, unspecified: Secondary | ICD-10-CM | POA: Insufficient documentation

## 2015-04-12 DIAGNOSIS — F410 Panic disorder [episodic paroxysmal anxiety] without agoraphobia: Secondary | ICD-10-CM | POA: Insufficient documentation

## 2015-04-12 DIAGNOSIS — Z880 Allergy status to penicillin: Secondary | ICD-10-CM | POA: Diagnosis not present

## 2015-04-12 LAB — COMPREHENSIVE METABOLIC PANEL
ALBUMIN: 2.8 g/dL — AB (ref 3.5–5.0)
ALK PHOS: 73 U/L (ref 38–126)
ALT: 14 U/L (ref 14–54)
AST: 17 U/L (ref 15–41)
Anion gap: 7 (ref 5–15)
BUN: 6 mg/dL (ref 6–20)
CHLORIDE: 95 mmol/L — AB (ref 101–111)
CO2: 31 mmol/L (ref 22–32)
Calcium: 7.9 mg/dL — ABNORMAL LOW (ref 8.9–10.3)
Creatinine, Ser: 0.59 mg/dL (ref 0.44–1.00)
GLUCOSE: 90 mg/dL (ref 65–99)
POTASSIUM: 3.8 mmol/L (ref 3.5–5.1)
Sodium: 133 mmol/L — ABNORMAL LOW (ref 135–145)
Total Bilirubin: 0.2 mg/dL — ABNORMAL LOW (ref 0.3–1.2)
Total Protein: 7 g/dL (ref 6.5–8.1)

## 2015-04-12 LAB — CBC WITH DIFFERENTIAL/PLATELET
Basophils Absolute: 0.1 10*3/uL (ref 0.0–0.1)
Basophils Relative: 1 % (ref 0–1)
EOS ABS: 0.1 10*3/uL (ref 0.0–0.7)
Eosinophils Relative: 1 % (ref 0–5)
HCT: 36.5 % (ref 36.0–46.0)
Hemoglobin: 12 g/dL (ref 12.0–15.0)
Lymphocytes Relative: 15 % (ref 12–46)
Lymphs Abs: 1.6 10*3/uL (ref 0.7–4.0)
MCH: 31.5 pg (ref 26.0–34.0)
MCHC: 32.9 g/dL (ref 30.0–36.0)
MCV: 95.8 fL (ref 78.0–100.0)
MONOS PCT: 10 % (ref 3–12)
Monocytes Absolute: 1.1 10*3/uL — ABNORMAL HIGH (ref 0.1–1.0)
NEUTROS PCT: 73 % (ref 43–77)
Neutro Abs: 8.1 10*3/uL — ABNORMAL HIGH (ref 1.7–7.7)
Platelets: 533 10*3/uL — ABNORMAL HIGH (ref 150–400)
RBC: 3.81 MIL/uL — ABNORMAL LOW (ref 3.87–5.11)
RDW: 13.1 % (ref 11.5–15.5)
WBC: 10.9 10*3/uL — ABNORMAL HIGH (ref 4.0–10.5)

## 2015-04-12 MED ORDER — ALBUTEROL SULFATE (2.5 MG/3ML) 0.083% IN NEBU
2.5000 mg | INHALATION_SOLUTION | Freq: Once | RESPIRATORY_TRACT | Status: AC
  Administered 2015-04-12: 2.5 mg via RESPIRATORY_TRACT
  Filled 2015-04-12: qty 3

## 2015-04-12 MED ORDER — METHYLPREDNISOLONE SODIUM SUCC 125 MG IJ SOLR
125.0000 mg | Freq: Once | INTRAMUSCULAR | Status: AC
  Administered 2015-04-12: 125 mg via INTRAVENOUS
  Filled 2015-04-12: qty 2

## 2015-04-12 MED ORDER — IPRATROPIUM-ALBUTEROL 0.5-2.5 (3) MG/3ML IN SOLN
3.0000 mL | Freq: Once | RESPIRATORY_TRACT | Status: AC
  Administered 2015-04-12: 3 mL via RESPIRATORY_TRACT
  Filled 2015-04-12: qty 3

## 2015-04-12 MED ORDER — LEVOFLOXACIN 500 MG PO TABS: 500.0000 mg | ORAL_TABLET | Freq: Every day | ORAL | Status: DC

## 2015-04-12 MED ORDER — HYDROCODONE-ACETAMINOPHEN 5-325 MG PO TABS: 1.0000 | ORAL_TABLET | Freq: Four times a day (QID) | ORAL | Status: DC | PRN

## 2015-04-12 MED ORDER — LEVOFLOXACIN 500 MG PO TABS
500.0000 mg | ORAL_TABLET | Freq: Once | ORAL | Status: AC
  Administered 2015-04-12: 500 mg via ORAL
  Filled 2015-04-12: qty 1

## 2015-04-12 NOTE — Discharge Instructions (Signed)
Follow up next week for recheck °

## 2015-04-12 NOTE — ED Notes (Signed)
Pt stated with cough and fever on Monday - states that she has been coughing up small amount , Hx of double pneumonia in past- Fever has been >101 -- took tylenol this am @ 0800

## 2015-04-12 NOTE — ED Provider Notes (Signed)
CSN: [id number]     Arrival date & time 04/12/15  1016 History  This chart was scribed for Milton Ferguson, MD by Rayna Sexton, ED scribe. This patient was seen in room APA15/APA15 and the patient's care was started at 11:12 AM.      Chief Complaint  Patient presents with  . Cough  . Fever   Patient is a 59 y.o. female presenting with cough and fever. The history is provided by the patient. No language interpreter was used.  Cough Cough characteristics:  Productive Sputum characteristics:  Green and yellow Severity:  Moderate Onset quality:  Sudden Duration:  3 days Timing:  Constant Progression:  Unchanged Smoker: yes   Associated symptoms: chest pain, chills, fever, myalgias and shortness of breath   Associated symptoms: no eye discharge, no headaches and no rash   Fever Temp source:  Subjective Severity:  Moderate Onset quality:  Sudden Duration:  3 days Timing:  Constant Progression:  Unchanged Chronicity:  New Relieved by:  Acetaminophen Associated symptoms: chest pain, chills, cough and myalgias   Associated symptoms: no congestion, no diarrhea, no headaches and no rash     HPI Comments: Lindsey Morton is a 59 y.o. female, with a history of COPD, who presents to the Emergency Department complaining of a constant, moderate, fever with onset 3 days ago-TMAX >101. Pt notes associated productive cough with yellowish-green sputum, CP and further notes a hx of double pneumonia. She notes a worsening of her pain when coughing and breathing deeply. Pt notes having a nebulizer, which she has been using, but denies taking any other medications. Pt notes that she has continued smoking on average .5 PPD. She notes taking 1 dose of tylenol 3 hours ago for her symptoms.   PCP: None Past Medical History  Diagnosis Date  . COPD (chronic obstructive pulmonary disease)   . Osteoporosis   . Osteoarthritis   . DDD (degenerative disc disease)   . Asthma   . Arthritis   . Depression    . Emphysema   . DDD (degenerative disc disease)   . Panic attack    Past Surgical History  Procedure Laterality Date  . Neck surgery  2007  . Lung surgery  1990   Family History  Problem Relation Age of Onset  . COPD     History  Substance Use Topics  . Smoking status: Current Every Day Smoker -- 0.50 packs/day    Types: Cigarettes  . Smokeless tobacco: Not on file  . Alcohol Use: No   OB History    No data available     Review of Systems  Constitutional: Positive for fever and chills. Negative for appetite change.  HENT: Negative for congestion, ear discharge and sinus pressure.   Eyes: Negative for discharge.  Respiratory: Positive for cough and shortness of breath.   Cardiovascular: Positive for chest pain.  Gastrointestinal: Negative for abdominal pain and diarrhea.  Genitourinary: Negative for frequency and hematuria.  Musculoskeletal: Positive for myalgias. Negative for back pain.  Skin: Negative for rash.  Neurological: Negative for seizures and headaches.  Psychiatric/Behavioral: Negative for hallucinations.   Allergies  Bactrim; Betadine; Codeine; and Penicillins  Home Medications   Prior to Admission medications   Medication Sig Start Date End Date Taking? Authorizing Provider  albuterol (PROVENTIL HFA;VENTOLIN HFA) 108 (90 BASE) MCG/ACT inhaler Inhale 2 puffs into the lungs every 6 (six) hours as needed. Shortness of Breath    Historical Provider, MD  albuterol (PROVENTIL) (2.5 MG/3ML) 0.083%  nebulizer solution Take 2.5 mg by nebulization every 6 (six) hours as needed. Shortness of Breath    Historical Provider, MD  ALPRAZolam Duanne Moron) 1 MG tablet Take 1 mg by mouth at bedtime as needed for anxiety or sleep.     Historical Provider, MD  aspirin 81 MG tablet Take 81 mg by mouth daily.      Historical Provider, MD  Multiple Vitamin (MULTIVITAMIN) tablet Take 1 tablet by mouth daily.      Historical Provider, MD  oxyCODONE-acetaminophen (PERCOCET/ROXICET)  5-325 MG per tablet Take 1 tablet by mouth every 6 (six) hours as needed. 01/13/15   Hope Bunnie Pion, NP  predniSONE (DELTASONE) 20 MG tablet Take 3 tablets (60 mg total) by mouth daily. Patient not taking: Reported on 01/13/2015 08/16/12   Nat Christen, MD  promethazine (PHENERGAN) 25 MG tablet Take 1 tablet (25 mg total) by mouth every 6 (six) hours as needed for nausea. Patient not taking: Reported on 01/13/2015 08/16/12   Nat Christen, MD   BP 116/68 mmHg  Pulse 105  Temp(Src) 99.3 F (37.4 C) (Oral)  Resp 18  Ht '5\' 7"'$  (1.702 m)  Wt 100 lb (45.36 kg)  BMI 15.66 kg/m2  SpO2 98% Physical Exam  Constitutional: She is oriented to person, place, and time.  HENT:  Head: Normocephalic.  Eyes: Conjunctivae and EOM are normal. No scleral icterus.  Neck: Neck supple. No thyromegaly present.  Cardiovascular: Normal rate and regular rhythm.  Exam reveals no gallop and no friction rub.   No murmur heard. Pulmonary/Chest: No stridor. She has rales.  Rales bilaterally in her lungs;  Abdominal: She exhibits no distension. There is no tenderness. There is no rebound.  Musculoskeletal: Normal range of motion. She exhibits no edema.  Lymphadenopathy:    She has no cervical adenopathy.  Neurological: She is oriented to person, place, and time. She exhibits normal muscle tone. Coordination normal.  Skin: No rash noted. No erythema.  Psychiatric: She has a normal mood and affect. Her behavior is normal.  Nursing note and vitals reviewed.   ED Course  Procedures  DIAGNOSTIC STUDIES: Oxygen Saturation is 98% on RA, normal by my interpretation.    COORDINATION OF CARE: 11:15 AM Discussed treatment plan with pt at bedside and pt agreed to plan.  Labs Review Labs Reviewed - No data to display  Imaging Review No results found.   EKG Interpretation None      MDM   Final diagnoses:  None    Pt with pneumonia and copd.   Pt offered admission to hospital.  Pt refused admission,  rx pneumonia with  levaquin and close follow up The chart was scribed for me under my direct supervision.  I personally performed the history, physical, and medical decision making and all procedures in the evaluation of this patient.Milton Ferguson, MD 04/12/15 [phone]

## 2016-05-25 ENCOUNTER — Emergency Department (HOSPITAL_COMMUNITY)
Admission: EM | Admit: 2016-05-25 | Discharge: 2016-05-25 | Disposition: A | Discharge status: Home / Self Care | Payer: 59 | Attending: Emergency Medicine | Admitting: Emergency Medicine

## 2016-05-25 ENCOUNTER — Emergency Department (HOSPITAL_COMMUNITY): Payer: 59

## 2016-05-25 ENCOUNTER — Encounter (HOSPITAL_COMMUNITY): Payer: Self-pay | Admitting: Emergency Medicine

## 2016-05-25 DIAGNOSIS — R0602 Shortness of breath: Secondary | ICD-10-CM | POA: Diagnosis present

## 2016-05-25 DIAGNOSIS — J449 Chronic obstructive pulmonary disease, unspecified: Secondary | ICD-10-CM | POA: Diagnosis not present

## 2016-05-25 DIAGNOSIS — J45909 Unspecified asthma, uncomplicated: Secondary | ICD-10-CM | POA: Insufficient documentation

## 2016-05-25 DIAGNOSIS — F17210 Nicotine dependence, cigarettes, uncomplicated: Secondary | ICD-10-CM | POA: Insufficient documentation

## 2016-05-25 DIAGNOSIS — J189 Pneumonia, unspecified organism: Secondary | ICD-10-CM | POA: Diagnosis not present

## 2016-05-25 DIAGNOSIS — Z7982 Long term (current) use of aspirin: Secondary | ICD-10-CM | POA: Insufficient documentation

## 2016-05-25 DIAGNOSIS — Z79899 Other long term (current) drug therapy: Secondary | ICD-10-CM | POA: Insufficient documentation

## 2016-05-25 LAB — CBC WITH DIFFERENTIAL/PLATELET
Basophils Absolute: 0.1 10*3/uL (ref 0.0–0.1)
Basophils Relative: 0 %
EOS ABS: 0.2 10*3/uL (ref 0.0–0.7)
Eosinophils Relative: 2 %
HCT: 31.8 % — ABNORMAL LOW (ref 36.0–46.0)
Hemoglobin: 10.5 g/dL — ABNORMAL LOW (ref 12.0–15.0)
Lymphocytes Relative: 13 %
Lymphs Abs: 1.7 10*3/uL (ref 0.7–4.0)
MCH: 32 pg (ref 26.0–34.0)
MCHC: 33 g/dL (ref 30.0–36.0)
MCV: 97 fL (ref 78.0–100.0)
MONOS PCT: 20 %
Monocytes Absolute: 2.7 10*3/uL — ABNORMAL HIGH (ref 0.1–1.0)
NEUTROS PCT: 66 %
Neutro Abs: 9 10*3/uL — ABNORMAL HIGH (ref 1.7–7.7)
PLATELETS: 422 10*3/uL — AB (ref 150–400)
RBC: 3.28 MIL/uL — ABNORMAL LOW (ref 3.87–5.11)
RDW: 13.3 % (ref 11.5–15.5)
WBC: 13.7 10*3/uL — ABNORMAL HIGH (ref 4.0–10.5)

## 2016-05-25 LAB — COMPREHENSIVE METABOLIC PANEL
ALK PHOS: 72 U/L (ref 38–126)
ALT: 25 U/L (ref 14–54)
AST: 45 U/L — ABNORMAL HIGH (ref 15–41)
Albumin: 2.4 g/dL — ABNORMAL LOW (ref 3.5–5.0)
Anion gap: 9 (ref 5–15)
BUN: 17 mg/dL (ref 6–20)
CO2: 29 mmol/L (ref 22–32)
CREATININE: 0.46 mg/dL (ref 0.44–1.00)
Calcium: 8.5 mg/dL — ABNORMAL LOW (ref 8.9–10.3)
Chloride: 97 mmol/L — ABNORMAL LOW (ref 101–111)
GFR calc non Af Amer: >60 mL/min (ref >60)
Glucose, Bld: 103 mg/dL — ABNORMAL HIGH (ref 65–99)
Potassium: 3.7 mmol/L (ref 3.5–5.1)
Sodium: 135 mmol/L (ref 135–145)
Total Bilirubin: 0.5 mg/dL (ref 0.3–1.2)
Total Protein: 6.2 g/dL — ABNORMAL LOW (ref 6.5–8.1)

## 2016-05-25 LAB — I-STAT CG4 LACTIC ACID, ED: Lactic Acid, Venous: 0.82 mmol/L (ref 0.5–1.9)

## 2016-05-25 LAB — TROPONIN I

## 2016-05-25 MED ORDER — SODIUM CHLORIDE 0.9 % IV BOLUS (SEPSIS)
1000.0000 mL | Freq: Once | INTRAVENOUS | Status: AC
  Administered 2016-05-25: 1000 mL via INTRAVENOUS

## 2016-05-25 MED ORDER — ALBUTEROL SULFATE (2.5 MG/3ML) 0.083% IN NEBU
5.0000 mg | INHALATION_SOLUTION | Freq: Once | RESPIRATORY_TRACT | Status: AC
  Administered 2016-05-25: 5 mg via RESPIRATORY_TRACT
  Filled 2016-05-25: qty 6

## 2016-05-25 MED ORDER — LEVOFLOXACIN 750 MG PO TABS: 750.0000 mg | ORAL_TABLET | Freq: Every day | ORAL | 0 refills | Status: DC

## 2016-05-25 MED ORDER — METHYLPREDNISOLONE SODIUM SUCC 125 MG IJ SOLR
125.0000 mg | Freq: Once | INTRAMUSCULAR | Status: AC
  Administered 2016-05-25: 125 mg via INTRAVENOUS
  Filled 2016-05-25: qty 2

## 2016-05-25 MED ORDER — LEVOFLOXACIN IN D5W 750 MG/150ML IV SOLN
750.0000 mg | Freq: Once | INTRAVENOUS | Status: AC
  Administered 2016-05-25: 750 mg via INTRAVENOUS
  Filled 2016-05-25: qty 150

## 2016-05-25 MED ORDER — PREDNISONE 20 MG PO TABS: 40.0000 mg | ORAL_TABLET | Freq: Every day | ORAL | 0 refills | Status: DC

## 2016-05-25 MED ORDER — GI COCKTAIL ~~LOC~~
30.0000 mL | Freq: Once | ORAL | Status: AC
  Administered 2016-05-25: 30 mL via ORAL
  Filled 2016-05-25: qty 30

## 2016-05-25 MED ORDER — SODIUM CHLORIDE 0.9 % IV BOLUS (SEPSIS)
500.0000 mL | Freq: Once | INTRAVENOUS | Status: AC
  Administered 2016-05-25: 500 mL via INTRAVENOUS

## 2016-05-25 NOTE — ED Triage Notes (Signed)
Pt reports sob, productive cough with green sputum, and chest pain in central chest radiating to back x3 days.  Pt thinks she has pneumonia, appears thin and weak.

## 2016-05-25 NOTE — ED Notes (Signed)
Patient requesting something for her stomach being "raw and burning". MD notified and orders received.

## 2016-05-25 NOTE — ED Notes (Signed)
Patient with no complaints at this time. Respirations even and unlabored. Skin warm/dry. Discharge instructions reviewed with patient at this time. Patient given opportunity to voice concerns/ask questions. IV removed per policy and band-aid applied to site. Patient discharged at this time and left Emergency Department via wheelchair.  

## 2016-05-25 NOTE — Discharge Instructions (Signed)
Levaquin once daily for 7 days Prednisone once daily for 5 days Use your inhaler 2 puffs every 4 hours for 24 hours, then every 4 hours as needed Please obtain all of your results from medical records or have your doctors office obtain the results - share them with your doctor - you should be seen at your doctors office in the next 2 days. Call today to arrange your follow up. Take the medications as prescribed. Please review all of the medicines and only take them if you do not have an allergy to them. Please be aware that if you are taking birth control pills, taking other prescriptions, ESPECIALLY ANTIBIOTICS may make the birth control ineffective - if this is the case, either do not engage in sexual activity or use alternative methods of birth control such as condoms until you have finished the medicine and your family doctor says it is OK to restart them. If you are on a blood thinner such as COUMADIN, be aware that any other medicine that you take may cause the coumadin to either work too much, or not enough - you should have your coumadin level rechecked in next 7 days if this is the case.  ?  It is also a possibility that you have an allergic reaction to any of the medicines that you have been prescribed - Everybody reacts differently to medications and while MOST people have no trouble with most medicines, you may have a reaction such as nausea, vomiting, rash, swelling, shortness of breath. If this is the case, please stop taking the medicine immediately and contact your physician.  ?  You should return to the ER if you develop severe or worsening symptoms.

## 2016-05-25 NOTE — ED Provider Notes (Signed)
[name] DEPT Provider Note   CSN: [id number] Arrival date & time: 05/25/16  1133  By signing my name below, I, Dolores Hoose, attest that this documentation has been prepared under the direction and in the presence of Noemi Chapel, MD . Electronically Signed: Dolores Hoose, Scribe. 05/25/2016. 11:39 AM.  History   Chief Complaint Chief Complaint  Patient presents with  . Shortness of Breath   The history is provided by the patient. No language interpreter was used.    HPI Comments:  Lindsey Morton is a 60 y.o. female who presents to the Emergency Department complaining of worsening constant productive cough onset three days ago. Pt has a PMHx of COPD and Asthma and notes that these symptoms are worse than her baseline. Pt notes she is compliant with her nebulizer and inhaler medications. She notes associated body pains, vomiting, wheezing, chest pain, shortness of breath, and unconfirmed fevers of 101-103. Pt denies any diarrhea and reports no home oxygen use. Pt was seen previously in July 2016 for pneumonia; she refused admission and was treated outpatient with antibiotics.    Past Medical History:  Diagnosis Date  . Arthritis   . Asthma   . COPD (chronic obstructive pulmonary disease) ([place])   . DDD (degenerative disc disease)   . DDD (degenerative disc disease)   . Depression   . Emphysema   . Osteoarthritis   . Osteoporosis   . Panic attack     Patient Active Problem List   Diagnosis Date Noted  . Syncope 09/22/2011  . Weight loss 09/22/2011    Past Surgical History:  Procedure Laterality Date  . LUNG SURGERY  1990  . NECK SURGERY  2007    OB History    No data available      Home Medications    Prior to Admission medications   Medication Sig Start Date End Date Taking? Authorizing Provider  acetaminophen (TYLENOL) 500 MG tablet Take 1,000 mg by mouth every 4 (four) hours as needed for fever.   Yes Historical Provider, MD  albuterol (PROVENTIL  HFA;VENTOLIN HFA) 108 (90 BASE) MCG/ACT inhaler Inhale 2 puffs into the lungs every 6 (six) hours as needed. Shortness of Breath   Yes Historical Provider, MD  albuterol (PROVENTIL) (2.5 MG/3ML) 0.083% nebulizer solution Take 2.5 mg by nebulization every 6 (six) hours as needed. Shortness of Breath   Yes Historical Provider, MD  ALPRAZolam Duanne Moron) 1 MG tablet Take 1 mg by mouth at bedtime as needed for anxiety or sleep.    Yes Historical Provider, MD  aspirin 81 MG tablet Take 81 mg by mouth daily.     Yes Historical Provider, MD  Multiple Vitamin (MULTIVITAMIN) tablet Take 1 tablet by mouth daily.     Yes Historical Provider, MD  levofloxacin (LEVAQUIN) 750 MG tablet Take 1 tablet (750 mg total) by mouth daily. 05/25/16   Noemi Chapel, MD  predniSONE (DELTASONE) 20 MG tablet Take 2 tablets (40 mg total) by mouth daily. 05/25/16   Noemi Chapel, MD    Family History Family History  Problem Relation Age of Onset  . COPD      Social History Social History  Substance Use Topics  . Smoking status: Current Every Day Smoker    Packs/day: 0.50    Types: Cigarettes  . Smokeless tobacco: Not on file  . Alcohol use No     Allergies   Bactrim; Betadine [povidone iodine]; Codeine; and Penicillins   Review of Systems Review of Systems  Constitutional:  Positive for fever.  Respiratory: Positive for cough, shortness of breath and wheezing.   Cardiovascular: Positive for chest pain.  Gastrointestinal: Positive for vomiting. Negative for diarrhea.  Musculoskeletal: Positive for myalgias (diffuse body pains).  All other systems reviewed and are negative.    Physical Exam Updated Vital Signs BP 126/64   Pulse 87   Temp 99.9 F (37.7 C) (Rectal)   Resp 22   SpO2 94%   Physical Exam  Constitutional: She appears well-developed and well-nourished. No distress.  Appears generally weak.   HENT:  Head: Normocephalic and atraumatic.  Mouth/Throat: Oropharynx is clear and moist. No  oropharyngeal exudate.  Eyes: Conjunctivae and EOM are normal. Pupils are equal, round, and reactive to light. Right eye exhibits no discharge. Left eye exhibits no discharge. No scleral icterus.  Neck: Normal range of motion. Neck supple. No JVD present. No thyromegaly present.  Cardiovascular: Normal rate, regular rhythm, normal heart sounds and intact distal pulses.  Exam reveals no gallop and no friction rub.   No murmur heard. Pulse of 95. No JVD. No edema.   Pulmonary/Chest: She has wheezes. She has rhonchi in the right upper field, the right middle field, the right lower field, the left upper field, the left middle field and the left lower field.  Rhonchi in all fields. Speaks in full sentences. Mild tachypnea. No accessory muscle use.   Abdominal: Soft. Bowel sounds are normal. She exhibits no distension and no mass. There is no tenderness.  Musculoskeletal: Normal range of motion. She exhibits no edema or tenderness.  Lymphadenopathy:    She has no cervical adenopathy.  Neurological: She is alert. Coordination normal.  Skin: Skin is warm and dry. No rash noted. No erythema.  Psychiatric: She has a normal mood and affect. Her behavior is normal.  Nursing note and vitals reviewed.    ED Treatments / Results  DIAGNOSTIC STUDIES:  Oxygen Saturation is 92% on RA, low by my interpretation.    COORDINATION OF CARE:  11:52 AM Discussed treatment plan with pt at bedside which includes fluids and pain medication and pt agreed to plan.  Labs (all labs ordered are listed, but only abnormal results are displayed) Labs Reviewed  COMPREHENSIVE METABOLIC PANEL - Abnormal; Notable for the following:       Result Value   Chloride 97 (*)    Glucose, Bld 103 (*)    Calcium 8.5 (*)    Total Protein 6.2 (*)    Albumin 2.4 (*)    AST 45 (*)    All other components within normal limits  CBC WITH DIFFERENTIAL/PLATELET - Abnormal; Notable for the following:    WBC 13.7 (*)    RBC 3.28 (*)      Hemoglobin 10.5 (*)    HCT 31.8 (*)    Platelets 422 (*)    Neutro Abs 9.0 (*)    Monocytes Absolute 2.7 (*)    All other components within normal limits  CULTURE, BLOOD (ROUTINE X 2)  CULTURE, BLOOD (ROUTINE X 2)  URINE CULTURE  TROPONIN I  URINALYSIS, ROUTINE W REFLEX MICROSCOPIC (NOT AT [facility])  I-STAT CG4 LACTIC ACID, ED    EKG  EKG Interpretation  Date/Time:  Sunday May 25 2016 11:44:43 EDT Ventricular Rate:  97 PR Interval:    QRS Duration: 89 QT Interval:  354 QTC Calculation: 450 R Axis:   39 Text Interpretation:  Sinus rhythm Probable LVH with secondary repol abnrm Baseline wander in lead(s) V2 since last tracing no  significant change Confirmed by Sabra Heck  MD, [place] ([id number]) on 05/25/2016 11:52:57 AM       Radiology Dg Chest 2 View  Result Date: 05/25/2016 CLINICAL DATA:  Shortness of breath with cough and chest pain EXAM: CHEST  2 VIEW COMPARISON:  October 31, 2015 FINDINGS: There is widespread scarring and fibrotic type change throughout the lungs bilaterally. In comparison with the prior study, there is increased opacity anteriorly on the lateral view with loss of a sharp right heart border on the frontal view. This appearance is felt to represent right middle lobe airspace consolidation superimposed on extensive chronic fibrotic type change. Heart size is normal. Pulmonary vascularity on the left is within normal limits. On the right, there is cicatrization which distorts the pulmonary vascularity on the right in a stable manner. There is no obvious adenopathy. No bone lesions are evident. IMPRESSION: Findings felt to represent right middle lobe airspace disease, likely pneumonia, superimposed on chronic fibrosis and scarring. Stable cardiac silhouette. Followup PA and lateral chest radiographs recommended in 3-4 weeks following trial of antibiotic therapy to ensure resolution and exclude underlying malignancy. Electronically Signed   By: Lowella Grip III M.D.   On:  05/25/2016 12:55    Procedures Procedures (including critical care time)  Medications Ordered in ED Medications  levofloxacin (LEVAQUIN) IVPB 750 mg (not administered)  methylPREDNISolone sodium succinate (SOLU-MEDROL) 125 mg/2 mL injection 125 mg (not administered)  albuterol (PROVENTIL) (2.5 MG/3ML) 0.083% nebulizer solution 5 mg (not administered)  sodium chloride 0.9 % bolus 1,000 mL (0 mLs Intravenous Stopped 05/25/16 1302)    And  sodium chloride 0.9 % bolus 500 mL (500 mLs Intravenous New Bag/Given 05/25/16 1303)     Initial Impression / Assessment and Plan / ED Course  I have reviewed the triage vital signs and the nursing notes.  Pertinent labs & imaging results that were available during my care of the patient were reviewed by me and considered in my medical decision making (see chart for details).  Clinical Course  Comment By Time  Labs reviewed including leukocytosis, I have personally reviewed the chest x-ray and find her to be chronic scarring of the lungs however there is also a superimposed right middle lobe infiltrate that could be consistent with pneumonia. The patient is ill-appearing with her shortness of breath, she is no longer hypotensive after IV fluids, she is not tachycardic at this time, her oxygen is gone from 92-94% with 2 L of nasal cannula, temperature was 99.9. She will need to be admitted to the internal medicine service secondary to her significant pneumonia which is causing significant symptoms though she does not appear septic or in shock. She has maintained her renal function. Noemi Chapel, MD 08/20 1314  I had a long discussion with the pt re: need for further treatment and advised her on admission - she has declined and after some discussion on shared decision making, she understands the severity of a pneumonia when you have compromised lung as she does and agrees to return should her symptoms worsen.  She has PCP follow up that she can arrange this week.    Noemi Chapel, MD 08/20 1319     Final Clinical Impressions(s) / ED Diagnoses   Final diagnoses:  CAP (community acquired pneumonia)    New Prescriptions New Prescriptions   LEVOFLOXACIN (LEVAQUIN) 750 MG TABLET    Take 1 tablet (750 mg total) by mouth daily.   PREDNISONE (DELTASONE) 20 MG TABLET    Take 2 tablets (  40 mg total) by mouth daily.    I personally performed the services described in this documentation, which was scribed in my presence. The recorded information has been reviewed and is accurate.        Noemi Chapel, MD 05/25/16 1326

## 2016-05-31 LAB — CULTURE, BLOOD (ROUTINE X 2)
CULTURE: NO GROWTH
Culture: NO GROWTH

## 2016-06-02 ENCOUNTER — Emergency Department (HOSPITAL_COMMUNITY): Payer: 59

## 2016-06-02 ENCOUNTER — Emergency Department (HOSPITAL_COMMUNITY)
Admission: EM | Admit: 2016-06-02 | Discharge: 2016-06-03 | Discharge status: Against Medical Advice | Payer: 59 | Attending: Emergency Medicine | Admitting: Emergency Medicine

## 2016-06-02 ENCOUNTER — Encounter (HOSPITAL_COMMUNITY): Payer: Self-pay | Admitting: Emergency Medicine

## 2016-06-02 DIAGNOSIS — M79605 Pain in left leg: Secondary | ICD-10-CM | POA: Insufficient documentation

## 2016-06-02 DIAGNOSIS — Z7982 Long term (current) use of aspirin: Secondary | ICD-10-CM | POA: Diagnosis not present

## 2016-06-02 DIAGNOSIS — J479 Bronchiectasis, uncomplicated: Secondary | ICD-10-CM | POA: Insufficient documentation

## 2016-06-02 DIAGNOSIS — R200 Anesthesia of skin: Secondary | ICD-10-CM | POA: Diagnosis not present

## 2016-06-02 DIAGNOSIS — J45909 Unspecified asthma, uncomplicated: Secondary | ICD-10-CM | POA: Diagnosis not present

## 2016-06-02 DIAGNOSIS — F17210 Nicotine dependence, cigarettes, uncomplicated: Secondary | ICD-10-CM | POA: Diagnosis not present

## 2016-06-02 DIAGNOSIS — M7989 Other specified soft tissue disorders: Secondary | ICD-10-CM | POA: Insufficient documentation

## 2016-06-02 DIAGNOSIS — Z79899 Other long term (current) drug therapy: Secondary | ICD-10-CM | POA: Diagnosis not present

## 2016-06-02 DIAGNOSIS — Z8701 Personal history of pneumonia (recurrent): Secondary | ICD-10-CM | POA: Insufficient documentation

## 2016-06-02 LAB — BASIC METABOLIC PANEL
Anion gap: 1 — ABNORMAL LOW (ref 5–15)
BUN: 9 mg/dL (ref 6–20)
CALCIUM: 7.6 mg/dL — AB (ref 8.9–10.3)
CO2: 35 mmol/L — ABNORMAL HIGH (ref 22–32)
CREATININE: 0.66 mg/dL (ref 0.44–1.00)
Chloride: 97 mmol/L — ABNORMAL LOW (ref 101–111)
Glucose, Bld: 91 mg/dL (ref 65–99)
Potassium: 3 mmol/L — ABNORMAL LOW (ref 3.5–5.1)
SODIUM: 133 mmol/L — AB (ref 135–145)

## 2016-06-02 LAB — BRAIN NATRIURETIC PEPTIDE: B NATRIURETIC PEPTIDE 5: 26 pg/mL (ref 0.0–100.0)

## 2016-06-02 LAB — CBC WITH DIFFERENTIAL/PLATELET
BASOS ABS: 0 10*3/uL (ref 0.0–0.1)
BASOS PCT: 0 %
EOS ABS: 0.6 10*3/uL (ref 0.0–0.7)
Eosinophils Relative: 5 %
HCT: 40.7 % (ref 36.0–46.0)
Hemoglobin: 13.4 g/dL (ref 12.0–15.0)
Lymphocytes Relative: 23 %
Lymphs Abs: 2.8 10*3/uL (ref 0.7–4.0)
MCH: 32.4 pg (ref 26.0–34.0)
MCHC: 32.9 g/dL (ref 30.0–36.0)
MCV: 98.3 fL (ref 78.0–100.0)
MONO ABS: 0.8 10*3/uL (ref 0.1–1.0)
MONOS PCT: 7 %
NEUTROS ABS: 7.9 10*3/uL — AB (ref 1.7–7.7)
Neutrophils Relative %: 65 %
PLATELETS: 748 10*3/uL — AB (ref 150–400)
RBC: 4.14 MIL/uL (ref 3.87–5.11)
RDW: 13.7 % (ref 11.5–15.5)
WBC: 12.1 10*3/uL — ABNORMAL HIGH (ref 4.0–10.5)

## 2016-06-02 LAB — D-DIMER, QUANTITATIVE (NOT AT ARMC): D DIMER QUANT: 0.6 ug{FEU}/mL — AB (ref 0.00–0.50)

## 2016-06-02 MED ORDER — IOPAMIDOL (ISOVUE-370) INJECTION 76%
100.0000 mL | Freq: Once | INTRAVENOUS | Status: AC | PRN
  Administered 2016-06-02: 100 mL via INTRAVENOUS

## 2016-06-02 MED ORDER — DOXYCYCLINE HYCLATE 100 MG PO CAPS: 100.0000 mg | ORAL_CAPSULE | Freq: Two times a day (BID) | ORAL | 0 refills | Status: DC

## 2016-06-02 NOTE — Discharge Instructions (Signed)
Take the antibiotics as prescribed and followup with a lung doctor. Return to the ED if you develop new or worsening symptoms.

## 2016-06-02 NOTE — ED Triage Notes (Signed)
Pt reports numbness and pain/swelling to left leg x3 days, painful ambulation. Pt alert and oriented.

## 2016-06-02 NOTE — ED Provider Notes (Signed)
[facility] DEPT Provider Note   CSN: [id number] Arrival date & time: 06/02/16  1609     History   Chief Complaint Chief Complaint  Patient presents with  . Leg Swelling    HPI GIANNAMARIE PAULUS is a 60 y.o. female.  Patient with history of COPD recent diagnosis of pneumonia presenting with left leg pain, burning and numbness and swelling for the past 3 days. No falls or injuries. She denies pins and needle sensation but complains of a "burning stinging pain" at the top of her left foot and left ankle and lower leg. There is no redness or swelling. There is no chest pain or shortness of breath. There is no abdominal pain, nausea and vomiting. No history of blood clots but she is concerned she may have one because she was in bed with her pneumonia. She states her breathing has improved and she has completed her antibiotics. No history of heart failure. Denies any weakness in her leg. Denies any bowel or bladder incontinence. Denies any fever or vomiting.   The history is provided by the patient.    Past Medical History:  Diagnosis Date  . Arthritis   . Asthma   . COPD (chronic obstructive pulmonary disease) ([place])   . DDD (degenerative disc disease)   . DDD (degenerative disc disease)   . Depression   . Emphysema   . Osteoarthritis   . Osteoporosis   . Panic attack     Patient Active Problem List   Diagnosis Date Noted  . Syncope 09/22/2011  . Weight loss 09/22/2011    Past Surgical History:  Procedure Laterality Date  . LUNG SURGERY  1990  . NECK SURGERY  2007    OB History    No data available       Home Medications    Prior to Admission medications   Medication Sig Start Date End Date Taking? Authorizing Provider  acetaminophen (TYLENOL) 500 MG tablet Take 1,000 mg by mouth every 4 (four) hours as needed for fever.    Historical Provider, MD  albuterol (PROVENTIL HFA;VENTOLIN HFA) 108 (90 BASE) MCG/ACT inhaler Inhale 2 puffs into the lungs every 6 (six)  hours as needed. Shortness of Breath    Historical Provider, MD  albuterol (PROVENTIL) (2.5 MG/3ML) 0.083% nebulizer solution Take 2.5 mg by nebulization every 6 (six) hours as needed. Shortness of Breath    Historical Provider, MD  ALPRAZolam Duanne Moron) 1 MG tablet Take 1 mg by mouth at bedtime as needed for anxiety or sleep.     Historical Provider, MD  aspirin 81 MG tablet Take 81 mg by mouth daily.      Historical Provider, MD  levofloxacin (LEVAQUIN) 750 MG tablet Take 1 tablet (750 mg total) by mouth daily. 05/25/16   Noemi Chapel, MD  Multiple Vitamin (MULTIVITAMIN) tablet Take 1 tablet by mouth daily.      Historical Provider, MD  predniSONE (DELTASONE) 20 MG tablet Take 2 tablets (40 mg total) by mouth daily. 05/25/16   Noemi Chapel, MD    Family History Family History  Problem Relation Age of Onset  . COPD      Social History Social History  Substance Use Topics  . Smoking status: Current Every Day Smoker    Packs/day: 0.50    Types: Cigarettes  . Smokeless tobacco: Not on file  . Alcohol use No     Allergies   Bactrim; Betadine [povidone iodine]; Codeine; and Penicillins   Review of Systems Review of  Systems  Constitutional: Negative for activity change, appetite change and fever.  HENT: Negative for congestion and voice change.   Eyes: Negative for visual disturbance.  Respiratory: Negative for cough, chest tightness and shortness of breath.   Cardiovascular: Positive for leg swelling. Negative for chest pain.  Gastrointestinal: Negative for abdominal pain, nausea and vomiting.  Genitourinary: Negative for dysuria, hematuria, vaginal bleeding and vaginal discharge.  Musculoskeletal: Positive for arthralgias and myalgias. Negative for back pain.  Skin: Negative for wound.  Neurological: Negative for dizziness, weakness, light-headedness and headaches.   A complete 10 system review of systems was obtained and all systems are negative except as noted in the HPI and PMH.     Physical Exam Updated Vital Signs BP 124/62 (BP Location: Left Arm)   Pulse 88   Temp 98.5 F (36.9 C) (Oral)   Resp 16   Ht '5\' 7"'$  (1.702 m)   Wt 101 lb (45.8 kg)   SpO2 96%   BMI 15.82 kg/m   Physical Exam  Constitutional: She is oriented to person, place, and time. She appears well-developed and well-nourished. No distress.  Thin appearing  HENT:  Head: Normocephalic and atraumatic.  Mouth/Throat: Oropharynx is clear and moist. No oropharyngeal exudate.  Eyes: Conjunctivae and EOM are normal. Pupils are equal, round, and reactive to light.  Neck: Normal range of motion. Neck supple.  No meningismus.  Cardiovascular: Normal rate, regular rhythm, normal heart sounds and intact distal pulses.   No murmur heard. Pulmonary/Chest: Effort normal and breath sounds normal. No respiratory distress.  Diminished.  Abdominal: Soft. There is no tenderness. There is no rebound and no guarding.  Musculoskeletal: Normal range of motion. She exhibits no tenderness.  Pain to palpation of the distal lower leg and dorsal left foot. There is no appreciable edema. Intact DP and PT pulses. Feet are cool. No calf tenderness  5/5 strength in bilateral lower extremities. Ankle plantar and dorsiflexion intact. Great toe extension intact bilaterally. +2 DP and PT pulses. Normal gait.   Neurological: She is alert and oriented to person, place, and time. No cranial nerve deficit. She exhibits normal muscle tone. Coordination normal.  No ataxia on finger to nose bilaterally. No pronator drift. 5/5 strength throughout. CN 2-12 intact.Equal grip strength. Sensation intact.   Skin: Skin is warm.  Psychiatric: She has a normal mood and affect. Her behavior is normal.  Nursing note and vitals reviewed.    ED Treatments / Results  Labs (all labs ordered are listed, but only abnormal results are displayed) Labs Reviewed  CBC WITH DIFFERENTIAL/PLATELET - Abnormal; Notable for the following:       Result  Value   WBC 12.1 (*)    Platelets 748 (*)    Neutro Abs 7.9 (*)    All other components within normal limits  BASIC METABOLIC PANEL - Abnormal; Notable for the following:    Sodium 133 (*)    Potassium 3.0 (*)    Chloride 97 (*)    CO2 35 (*)    Calcium 7.6 (*)    Anion gap 1 (*)    All other components within normal limits  D-DIMER, QUANTITATIVE (NOT AT [facility]) - Abnormal; Notable for the following:    D-Dimer, Quant 0.60 (*)    All other components within normal limits  BRAIN NATRIURETIC PEPTIDE  QUANTIFERON TB GOLD ASSAY (BLOOD)    EKG  EKG Interpretation None       Radiology Dg Chest 2 View  Result Date: 06/02/2016  CLINICAL DATA:  60 year old female with history of pneumonia. EXAM: CHEST  2 VIEW COMPARISON:  Chest x-ray 05/25/2016. FINDINGS: Postoperative changes of right upper lobectomy are again noted. Extensive bilateral apical pleural parenchymal thickening is again noted, similar to the recent prior examinations. Multifocal areas of architectural distortion (some of which are reticulonodular in appearance) are again noted throughout the lungs bilaterally, similar to the prior examination, favored to reflect multifocal scarring from chronic atypical infection. The overall extent of multifocal nodularity and airspace disease appears relatively similar to the prior examination from 05/25/2016. No significant pleural effusions. No evidence of pulmonary edema. Heart size is normal. Mediastinal contours are grossly distorted, similar to the prior examination. Aortic atherosclerosis. Orthopedic fixation hardware in the lower cervical spine is incidentally noted. IMPRESSION: 1. Overall, there has been very little interval change in the radiographic appearance the chest, as above. These findings are of uncertain etiology and significance, but are favored to be related to a chronic atypical infectious process such as MAI (mycobacterium avium intracellulare). These findings could be better  evaluated with followup chest CT. If the patient has normal renal function, chest CT should preferably be performed with IV contrast, as the possibility of underlying malignancy, while not favored, is not excluded. 2. Aortic atherosclerosis. Electronically Signed   By: Vinnie Langton M.D.   On: 06/02/2016 19:33   Ct Angio Chest Pe W And/or Wo Contrast  Result Date: 06/02/2016 CLINICAL DATA:  Numbness and pain and swelling to the left leg for 3 days. Painful ambulation. Abnormal chest x-ray suggested chronic changes. EXAM: CT ANGIOGRAPHY CHEST WITH CONTRAST TECHNIQUE: Multidetector CT imaging of the chest was performed using the standard protocol during bolus administration of intravenous contrast. Multiplanar CT image reconstructions and MIPs were obtained to evaluate the vascular anatomy. CONTRAST:  100 mL Isovue 370 COMPARISON:  None. FINDINGS: Technically adequate study with good opacification of the central and segmental pulmonary arteries. No focal filling defects are demonstrated. No evidence of significant pulmonary embolus. Cardiac enlargement. Pulmonary arteries are diffusely enlarged, possibly indicating pulmonary arterial hypertension. Normal caliber thoracic aorta with scattered calcifications. No aortic dissection. Great vessel origins are patent. Small pericardial effusion. Esophagus is decompressed. No significant lymphadenopathy in the chest. Diffuse pleural thickening in the lung apices and posterior lungs as well as in the right and left cardiophrenic angles. No definite effusion. No pneumothorax. Bronchiectasis in the upper lungs with scarring bilaterally. Patchy nodular interstitial changes, associated with peribronchial thickening and mucous plugging. Some of the nodular changes demonstrate cavitation. The largest focal lesion is demonstrated in the left lung base and represents cavitary lesion measuring 1.2 x 2.8 cm. There is associated pleural thickening. Changes are nonspecific but are  likely representing chronic inflammatory process. Consider TB or atypical mycobacterial infections. Consider fungal infections. Cancer metastatic disease is felt less likely although not entirely excluded. Small esophageal hiatal hernia. Included portions of the upper abdominal organs are grossly unremarkable. Degenerative changes throughout the spine. Anterior compression fracture of the L1 vertebra demonstrates irregular anterior margins in may be acute. Review of the MIP images confirms the above findings. IMPRESSION: 1. No evidence of significant pulmonary embolus. 2. Cardiac enlargement with enlarged pulmonary arteries suggesting pulmonary arterial hypertension. 3. Diffuse lung disease. There is diffuse bronchiectasis most prominent in the upper lungs associated atelectasis and diffuse nodular infiltrates. Some areas are cavitated. Diffuse interstitial fibrosis. Pleural thickening. Changes likely represent chronic inflammatory process. Consider atypical infection such as TB or atypical mycobacteria. Neoplasm is not entirely excluded but felt  less likely seen in a trip process. Superimposed neoplasm on chronic disease could not be excluded. 4. Anterior compression of the L1 vertebra, possibly acute. Electronically Signed   By: Lucienne Capers M.D.   On: 06/02/2016 23:13    Procedures Procedures (including critical care time)  Medications Ordered in ED Medications - No data to display   Initial Impression / Assessment and Plan / ED Course  I have reviewed the triage vital signs and the nursing notes.  Pertinent labs & imaging results that were available during my care of the patient were reviewed by me and considered in my medical decision making (see chart for details).  Clinical Course   Burning pain to left foot and ankle without trauma. Feet are cool but pulses are present. Palpable and with doppler. Flexion and extension of ankle and great toe are intact. Low suspicion for cord compression  or cauda equina. Patient does not have any sciatica type symptoms.  Continues to have cough since treated for PNA last week. Completed antibiotics yesterday. No fever.  CXR similar to previous with extensive lung disease.  CT scan results d/w Dr. Oletta Darter of pulmonary.  Patient has likely bronchiectasis and lung scarring from her lung disease. She has no constitutional symptoms. No fever, no night sweats, no weight loss., Doubt tuberculosis. Patient was born in the [place].  Dr. Oletta Darter agrees. We'll restart antibiotics and need to establish care with pulmonary.  Patient needs to have Doppler ultrasound tomorrow which will be arranged.  Patient continues to decline admission for PNA and borderline hypoxia.  She refuses any TB blood work. She states she is "going home right now" and will not have any further testing. She appears to have capacity to make medical decisions and will leave against medical advice.  Patient requesting to leave against medical advice. He is alert and oriented x3 and appears to have decision making capacity. Denies suicidal or homicidal ideation.  Discussed that admission and further testing is recommended and emergent medical conditions have not been ruled out. Discussed that leaving against medical advice may result in clinical deterioration and possible death.   Final Clinical Impressions(s) / ED Diagnoses   Final diagnoses:  Left leg pain  Bronchiectasis without complication [facility])    New Prescriptions New Prescriptions   No medications on file     Ezequiel Essex, MD 06/03/16 1446

## 2016-06-02 NOTE — ED Notes (Signed)
Patient is refusing blood work at this time

## 2016-06-03 NOTE — ED Notes (Signed)
Patient verbalizes understanding of discharge instructions, prescriptions, home care and follow up care. Patient out of department at this time with family. 

## 2016-09-06 IMAGING — DX DG CHEST 2V
2 series · 2 of 2 positions shown · non-contrast
Comparison: Chest x-ray 05/25/2016.

CLINICAL DATA: 59-year-old female with history of pneumonia.

EXAM:
CHEST  2 VIEW

[chest pa]
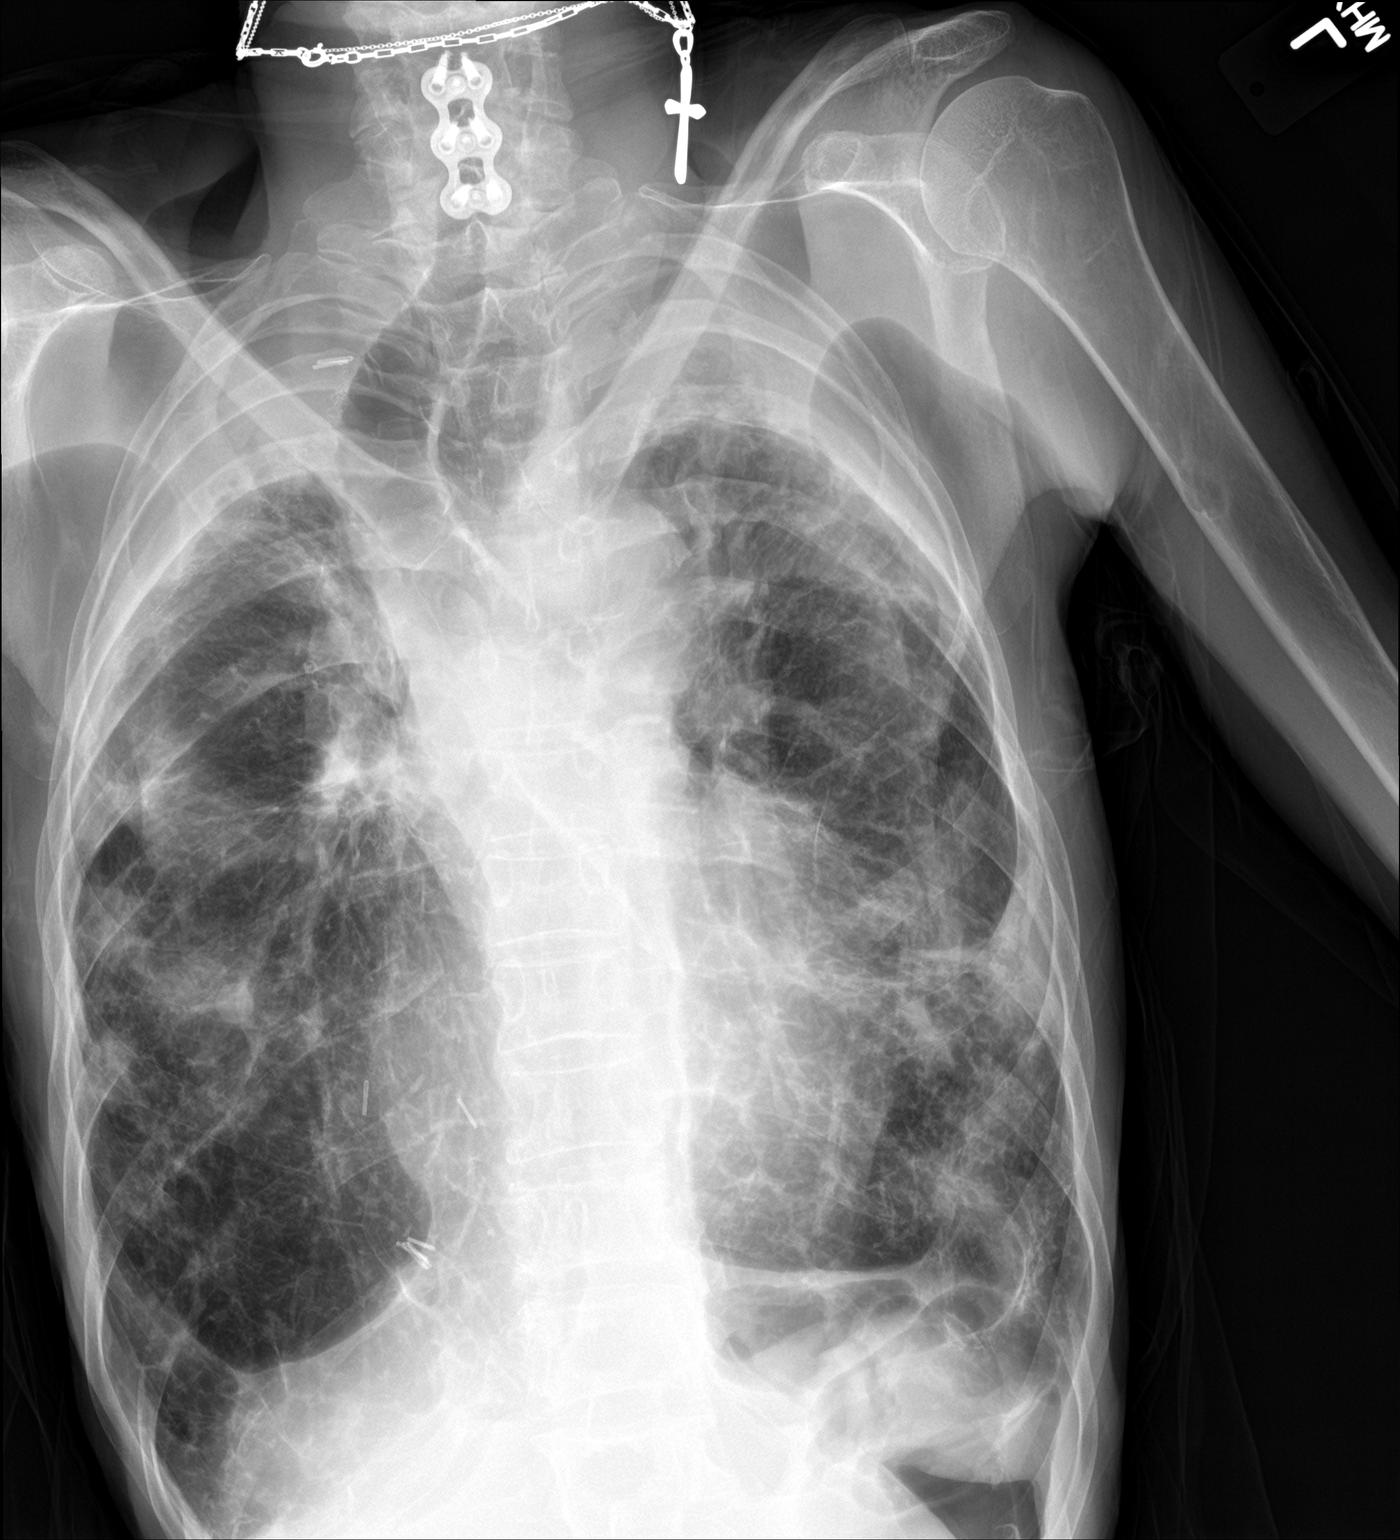

[chest lat]
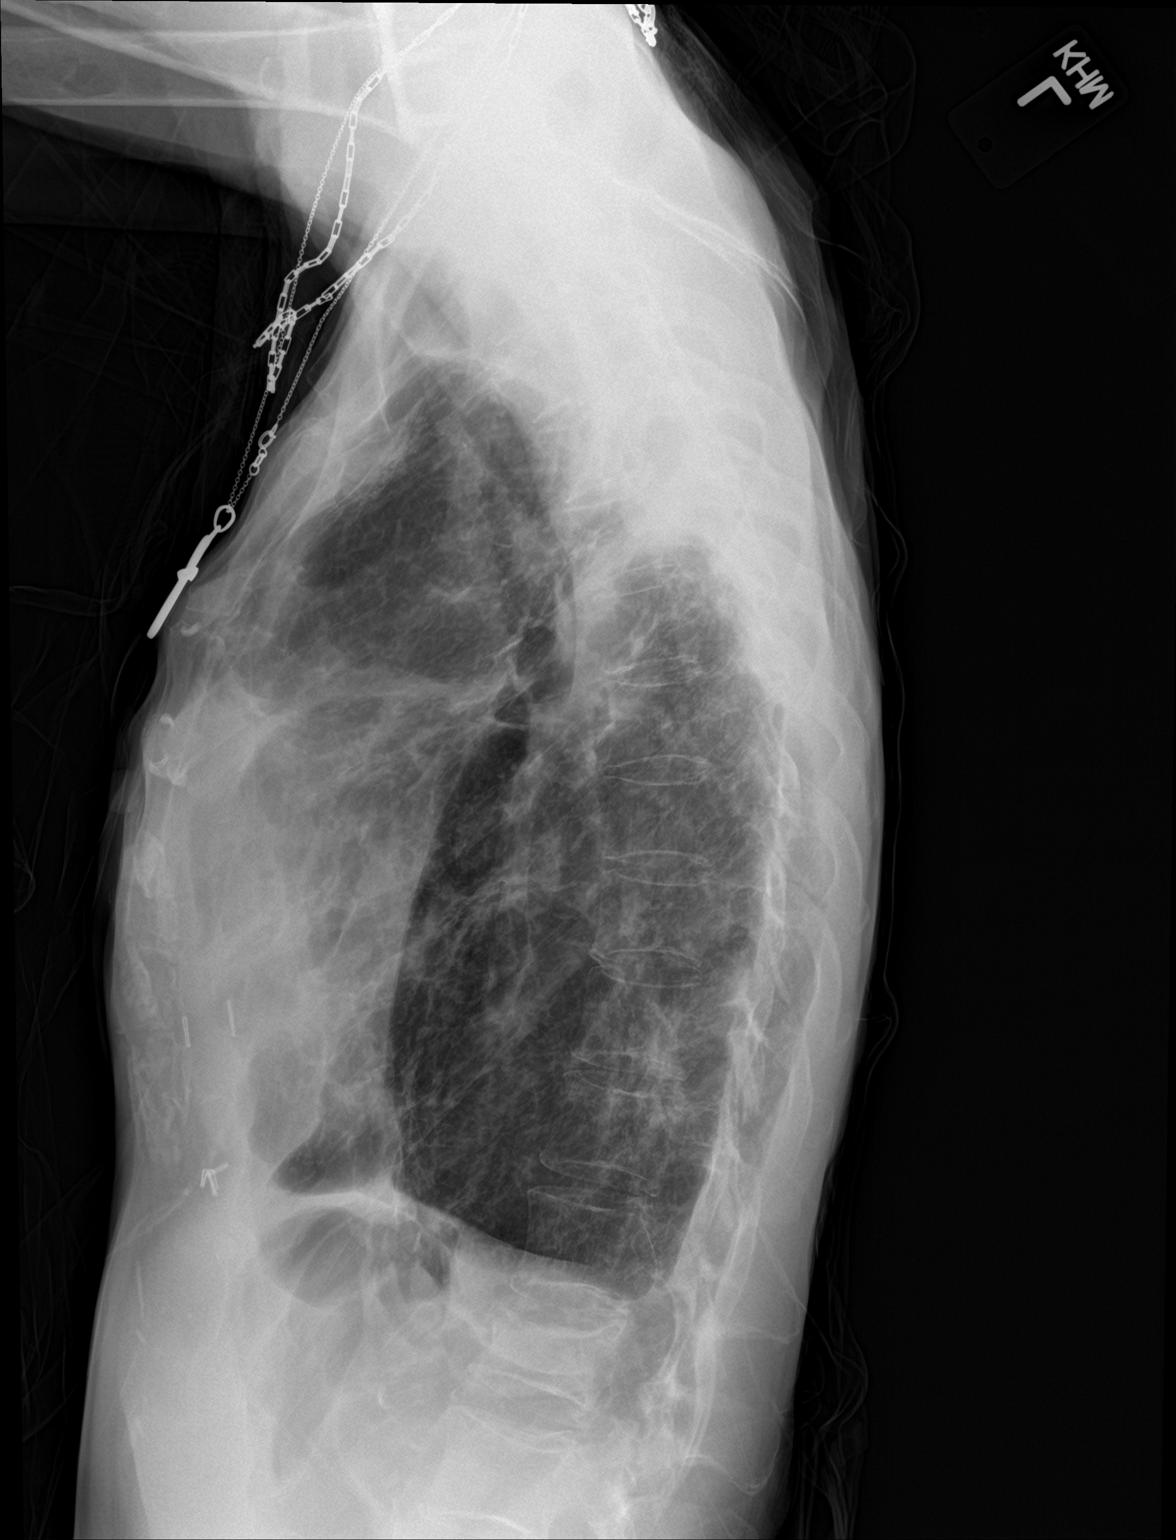

[2 of 2 positions shown; findings below may reference images not displayed]

FINDINGS: Postoperative changes of right upper lobectomy are again noted.
Extensive bilateral apical pleural parenchymal thickening is again
noted, similar to the recent prior examinations. Multifocal areas of
architectural distortion (some of which are reticulonodular in
appearance) are again noted throughout the lungs bilaterally,
similar to the prior examination, favored to reflect multifocal
scarring from chronic atypical infection. The overall extent of
multifocal nodularity and airspace disease appears relatively
similar to the prior examination from 05/25/2016. No significant
pleural effusions. No evidence of pulmonary edema. Heart size is
normal. Mediastinal contours are grossly distorted, similar to the
prior examination. Aortic atherosclerosis. Orthopedic fixation
hardware in the lower cervical spine is incidentally noted.
IMPRESSION: 1. Overall, there has been very little interval change in the
radiographic appearance the chest, as above. These findings are of
uncertain etiology and significance, but are favored to be related
to a chronic atypical infectious process such as LOREDO (mycobacterium
avium intracellulare). These findings could be better evaluated with
followup chest CT. If the patient has normal renal function, chest
CT should preferably be performed with IV contrast, as the
possibility of underlying malignancy, while not favored, is not
excluded.
2. Aortic atherosclerosis.

## 2017-11-04 DIAGNOSIS — Z79899 Other long term (current) drug therapy: Secondary | ICD-10-CM | POA: Diagnosis not present

## 2017-11-11 DIAGNOSIS — Z79890 Hormone replacement therapy (postmenopausal): Secondary | ICD-10-CM | POA: Diagnosis not present

## 2017-11-11 DIAGNOSIS — Z79899 Other long term (current) drug therapy: Secondary | ICD-10-CM | POA: Diagnosis not present

## 2017-11-11 DIAGNOSIS — Z760 Encounter for issue of repeat prescription: Secondary | ICD-10-CM | POA: Diagnosis not present

## 2017-11-11 DIAGNOSIS — J449 Chronic obstructive pulmonary disease, unspecified: Secondary | ICD-10-CM | POA: Diagnosis not present

## 2017-11-11 DIAGNOSIS — R69 Illness, unspecified: Secondary | ICD-10-CM | POA: Diagnosis not present

## 2017-12-11 DIAGNOSIS — Z886 Allergy status to analgesic agent status: Secondary | ICD-10-CM | POA: Diagnosis not present

## 2017-12-11 DIAGNOSIS — J441 Chronic obstructive pulmonary disease with (acute) exacerbation: Secondary | ICD-10-CM | POA: Diagnosis not present

## 2017-12-11 DIAGNOSIS — J449 Chronic obstructive pulmonary disease, unspecified: Secondary | ICD-10-CM | POA: Diagnosis not present

## 2017-12-11 DIAGNOSIS — Z9119 Patient's noncompliance with other medical treatment and regimen: Secondary | ICD-10-CM | POA: Diagnosis not present

## 2017-12-11 DIAGNOSIS — Z79899 Other long term (current) drug therapy: Secondary | ICD-10-CM | POA: Diagnosis not present

## 2017-12-11 DIAGNOSIS — R0602 Shortness of breath: Secondary | ICD-10-CM | POA: Diagnosis not present

## 2017-12-11 DIAGNOSIS — R69 Illness, unspecified: Secondary | ICD-10-CM | POA: Diagnosis not present

## 2017-12-11 DIAGNOSIS — J9601 Acute respiratory failure with hypoxia: Secondary | ICD-10-CM | POA: Diagnosis not present

## 2017-12-11 DIAGNOSIS — R0902 Hypoxemia: Secondary | ICD-10-CM | POA: Diagnosis not present

## 2017-12-11 DIAGNOSIS — Z720 Tobacco use: Secondary | ICD-10-CM | POA: Diagnosis not present

## 2017-12-11 DIAGNOSIS — J9602 Acute respiratory failure with hypercapnia: Secondary | ICD-10-CM | POA: Diagnosis not present

## 2017-12-11 DIAGNOSIS — Z888 Allergy status to other drugs, medicaments and biological substances status: Secondary | ICD-10-CM | POA: Diagnosis not present

## 2017-12-17 DIAGNOSIS — J449 Chronic obstructive pulmonary disease, unspecified: Secondary | ICD-10-CM | POA: Diagnosis not present

## 2017-12-17 DIAGNOSIS — R69 Illness, unspecified: Secondary | ICD-10-CM | POA: Diagnosis not present

## 2018-01-13 DIAGNOSIS — J449 Chronic obstructive pulmonary disease, unspecified: Secondary | ICD-10-CM | POA: Diagnosis not present

## 2018-02-03 DIAGNOSIS — J849 Interstitial pulmonary disease, unspecified: Secondary | ICD-10-CM | POA: Diagnosis not present

## 2018-02-03 DIAGNOSIS — Z720 Tobacco use: Secondary | ICD-10-CM | POA: Diagnosis not present

## 2018-02-03 DIAGNOSIS — R0602 Shortness of breath: Secondary | ICD-10-CM | POA: Diagnosis not present

## 2018-02-03 DIAGNOSIS — J449 Chronic obstructive pulmonary disease, unspecified: Secondary | ICD-10-CM | POA: Diagnosis not present

## 2018-02-03 DIAGNOSIS — R69 Illness, unspecified: Secondary | ICD-10-CM | POA: Diagnosis not present

## 2018-02-12 DIAGNOSIS — J449 Chronic obstructive pulmonary disease, unspecified: Secondary | ICD-10-CM | POA: Diagnosis not present

## 2018-03-01 DIAGNOSIS — G8929 Other chronic pain: Secondary | ICD-10-CM | POA: Diagnosis not present

## 2018-03-01 DIAGNOSIS — Z79899 Other long term (current) drug therapy: Secondary | ICD-10-CM | POA: Diagnosis not present

## 2018-03-01 DIAGNOSIS — M542 Cervicalgia: Secondary | ICD-10-CM | POA: Diagnosis not present

## 2018-03-01 DIAGNOSIS — G542 Cervical root disorders, not elsewhere classified: Secondary | ICD-10-CM | POA: Diagnosis not present

## 2018-03-08 DIAGNOSIS — G8929 Other chronic pain: Secondary | ICD-10-CM | POA: Diagnosis not present

## 2018-03-08 DIAGNOSIS — G542 Cervical root disorders, not elsewhere classified: Secondary | ICD-10-CM | POA: Diagnosis not present

## 2018-03-08 DIAGNOSIS — R110 Nausea: Secondary | ICD-10-CM | POA: Diagnosis not present

## 2018-03-08 DIAGNOSIS — M542 Cervicalgia: Secondary | ICD-10-CM | POA: Diagnosis not present

## 2018-03-15 DIAGNOSIS — J449 Chronic obstructive pulmonary disease, unspecified: Secondary | ICD-10-CM | POA: Diagnosis not present

## 2018-04-06 DIAGNOSIS — G8929 Other chronic pain: Secondary | ICD-10-CM | POA: Diagnosis not present

## 2018-04-06 DIAGNOSIS — G542 Cervical root disorders, not elsewhere classified: Secondary | ICD-10-CM | POA: Diagnosis not present

## 2018-04-06 DIAGNOSIS — Z79899 Other long term (current) drug therapy: Secondary | ICD-10-CM | POA: Diagnosis not present

## 2018-04-06 DIAGNOSIS — M542 Cervicalgia: Secondary | ICD-10-CM | POA: Diagnosis not present

## 2018-04-06 DIAGNOSIS — R0902 Hypoxemia: Secondary | ICD-10-CM | POA: Diagnosis not present

## 2018-04-14 DIAGNOSIS — J449 Chronic obstructive pulmonary disease, unspecified: Secondary | ICD-10-CM | POA: Diagnosis not present

## 2018-05-15 DIAGNOSIS — J449 Chronic obstructive pulmonary disease, unspecified: Secondary | ICD-10-CM | POA: Diagnosis not present

## 2018-05-16 DIAGNOSIS — R0902 Hypoxemia: Secondary | ICD-10-CM | POA: Diagnosis not present

## 2018-05-16 DIAGNOSIS — J440 Chronic obstructive pulmonary disease with acute lower respiratory infection: Secondary | ICD-10-CM | POA: Diagnosis not present

## 2018-05-16 DIAGNOSIS — J984 Other disorders of lung: Secondary | ICD-10-CM | POA: Diagnosis not present

## 2018-05-16 DIAGNOSIS — J189 Pneumonia, unspecified organism: Secondary | ICD-10-CM | POA: Diagnosis not present

## 2018-05-16 DIAGNOSIS — Z9981 Dependence on supplemental oxygen: Secondary | ICD-10-CM | POA: Diagnosis not present

## 2018-05-16 DIAGNOSIS — J181 Lobar pneumonia, unspecified organism: Secondary | ICD-10-CM | POA: Diagnosis not present

## 2018-05-16 DIAGNOSIS — Z681 Body mass index (BMI) 19 or less, adult: Secondary | ICD-10-CM | POA: Diagnosis not present

## 2018-05-16 DIAGNOSIS — J9601 Acute respiratory failure with hypoxia: Secondary | ICD-10-CM | POA: Diagnosis not present

## 2018-05-16 DIAGNOSIS — D638 Anemia in other chronic diseases classified elsewhere: Secondary | ICD-10-CM | POA: Diagnosis not present

## 2018-05-16 DIAGNOSIS — J9611 Chronic respiratory failure with hypoxia: Secondary | ICD-10-CM | POA: Diagnosis not present

## 2018-05-16 DIAGNOSIS — D649 Anemia, unspecified: Secondary | ICD-10-CM | POA: Diagnosis not present

## 2018-05-16 DIAGNOSIS — R69 Illness, unspecified: Secondary | ICD-10-CM | POA: Diagnosis not present

## 2018-05-16 DIAGNOSIS — R918 Other nonspecific abnormal finding of lung field: Secondary | ICD-10-CM | POA: Diagnosis not present

## 2018-05-16 DIAGNOSIS — E43 Unspecified severe protein-calorie malnutrition: Secondary | ICD-10-CM | POA: Diagnosis not present

## 2018-05-16 DIAGNOSIS — Z720 Tobacco use: Secondary | ICD-10-CM | POA: Diagnosis not present

## 2018-05-16 DIAGNOSIS — R0602 Shortness of breath: Secondary | ICD-10-CM | POA: Diagnosis not present

## 2018-05-16 DIAGNOSIS — J441 Chronic obstructive pulmonary disease with (acute) exacerbation: Secondary | ICD-10-CM | POA: Diagnosis not present

## 2018-05-16 DIAGNOSIS — D509 Iron deficiency anemia, unspecified: Secondary | ICD-10-CM | POA: Diagnosis not present

## 2018-05-16 DIAGNOSIS — J9612 Chronic respiratory failure with hypercapnia: Secondary | ICD-10-CM | POA: Diagnosis not present

## 2018-05-17 DIAGNOSIS — J441 Chronic obstructive pulmonary disease with (acute) exacerbation: Secondary | ICD-10-CM | POA: Diagnosis not present

## 2018-05-17 DIAGNOSIS — J984 Other disorders of lung: Secondary | ICD-10-CM | POA: Diagnosis not present

## 2018-05-18 DIAGNOSIS — J441 Chronic obstructive pulmonary disease with (acute) exacerbation: Secondary | ICD-10-CM | POA: Diagnosis not present

## 2018-05-18 DIAGNOSIS — J189 Pneumonia, unspecified organism: Secondary | ICD-10-CM | POA: Diagnosis not present

## 2018-05-19 DIAGNOSIS — R918 Other nonspecific abnormal finding of lung field: Secondary | ICD-10-CM | POA: Diagnosis not present

## 2018-05-19 DIAGNOSIS — E43 Unspecified severe protein-calorie malnutrition: Secondary | ICD-10-CM | POA: Diagnosis not present

## 2018-05-19 DIAGNOSIS — D638 Anemia in other chronic diseases classified elsewhere: Secondary | ICD-10-CM | POA: Diagnosis not present

## 2018-05-19 DIAGNOSIS — J441 Chronic obstructive pulmonary disease with (acute) exacerbation: Secondary | ICD-10-CM | POA: Diagnosis not present

## 2018-05-20 DIAGNOSIS — R0902 Hypoxemia: Secondary | ICD-10-CM | POA: Diagnosis not present

## 2018-05-20 DIAGNOSIS — R69 Illness, unspecified: Secondary | ICD-10-CM | POA: Diagnosis not present

## 2018-05-20 DIAGNOSIS — J189 Pneumonia, unspecified organism: Secondary | ICD-10-CM | POA: Diagnosis not present

## 2018-05-20 DIAGNOSIS — J441 Chronic obstructive pulmonary disease with (acute) exacerbation: Secondary | ICD-10-CM | POA: Diagnosis not present

## 2018-06-15 DIAGNOSIS — J449 Chronic obstructive pulmonary disease, unspecified: Secondary | ICD-10-CM | POA: Diagnosis not present

## 2018-06-19 DIAGNOSIS — J449 Chronic obstructive pulmonary disease, unspecified: Secondary | ICD-10-CM | POA: Diagnosis not present

## 2018-06-19 DIAGNOSIS — F410 Panic disorder [episodic paroxysmal anxiety] without agoraphobia: Secondary | ICD-10-CM | POA: Diagnosis not present

## 2018-06-19 DIAGNOSIS — R69 Illness, unspecified: Secondary | ICD-10-CM | POA: Diagnosis not present

## 2018-06-19 DIAGNOSIS — Z79899 Other long term (current) drug therapy: Secondary | ICD-10-CM | POA: Diagnosis not present

## 2018-07-15 DIAGNOSIS — J449 Chronic obstructive pulmonary disease, unspecified: Secondary | ICD-10-CM | POA: Diagnosis not present

## 2018-08-15 DIAGNOSIS — J449 Chronic obstructive pulmonary disease, unspecified: Secondary | ICD-10-CM | POA: Diagnosis not present

## 2018-09-14 DIAGNOSIS — J449 Chronic obstructive pulmonary disease, unspecified: Secondary | ICD-10-CM | POA: Diagnosis not present

## 2018-10-15 DIAGNOSIS — J449 Chronic obstructive pulmonary disease, unspecified: Secondary | ICD-10-CM | POA: Diagnosis not present

## 2018-11-02 DIAGNOSIS — J449 Chronic obstructive pulmonary disease, unspecified: Secondary | ICD-10-CM | POA: Diagnosis not present

## 2018-11-02 DIAGNOSIS — Z87891 Personal history of nicotine dependence: Secondary | ICD-10-CM | POA: Diagnosis not present

## 2018-11-02 DIAGNOSIS — Z79899 Other long term (current) drug therapy: Secondary | ICD-10-CM | POA: Diagnosis not present

## 2018-11-15 DIAGNOSIS — J449 Chronic obstructive pulmonary disease, unspecified: Secondary | ICD-10-CM | POA: Diagnosis not present

## 2018-12-10 DIAGNOSIS — J9622 Acute and chronic respiratory failure with hypercapnia: Secondary | ICD-10-CM | POA: Diagnosis not present

## 2018-12-10 DIAGNOSIS — Z4682 Encounter for fitting and adjustment of non-vascular catheter: Secondary | ICD-10-CM | POA: Diagnosis not present

## 2018-12-10 DIAGNOSIS — J9621 Acute and chronic respiratory failure with hypoxia: Secondary | ICD-10-CM | POA: Diagnosis not present

## 2018-12-10 DIAGNOSIS — Z79899 Other long term (current) drug therapy: Secondary | ICD-10-CM | POA: Diagnosis not present

## 2018-12-10 DIAGNOSIS — J90 Pleural effusion, not elsewhere classified: Secondary | ICD-10-CM | POA: Diagnosis not present

## 2018-12-10 DIAGNOSIS — R0602 Shortness of breath: Secondary | ICD-10-CM | POA: Diagnosis not present

## 2018-12-10 DIAGNOSIS — J188 Other pneumonia, unspecified organism: Secondary | ICD-10-CM | POA: Diagnosis not present

## 2018-12-10 DIAGNOSIS — J181 Lobar pneumonia, unspecified organism: Secondary | ICD-10-CM | POA: Diagnosis not present

## 2018-12-10 DIAGNOSIS — Z7951 Long term (current) use of inhaled steroids: Secondary | ICD-10-CM | POA: Diagnosis not present

## 2018-12-11 DIAGNOSIS — E43 Unspecified severe protein-calorie malnutrition: Secondary | ICD-10-CM | POA: Diagnosis not present

## 2018-12-11 DIAGNOSIS — J9601 Acute respiratory failure with hypoxia: Secondary | ICD-10-CM | POA: Diagnosis not present

## 2018-12-11 DIAGNOSIS — Z7951 Long term (current) use of inhaled steroids: Secondary | ICD-10-CM | POA: Diagnosis not present

## 2018-12-11 DIAGNOSIS — J154 Pneumonia due to other streptococci: Secondary | ICD-10-CM | POA: Diagnosis not present

## 2018-12-11 DIAGNOSIS — Z79899 Other long term (current) drug therapy: Secondary | ICD-10-CM | POA: Diagnosis not present

## 2018-12-11 DIAGNOSIS — I509 Heart failure, unspecified: Secondary | ICD-10-CM | POA: Diagnosis not present

## 2018-12-11 DIAGNOSIS — R0602 Shortness of breath: Secondary | ICD-10-CM | POA: Diagnosis not present

## 2018-12-11 DIAGNOSIS — I5031 Acute diastolic (congestive) heart failure: Secondary | ICD-10-CM | POA: Diagnosis not present

## 2018-12-11 DIAGNOSIS — J9602 Acute respiratory failure with hypercapnia: Secondary | ICD-10-CM | POA: Diagnosis not present

## 2018-12-11 DIAGNOSIS — J449 Chronic obstructive pulmonary disease, unspecified: Secondary | ICD-10-CM | POA: Diagnosis not present

## 2018-12-11 DIAGNOSIS — Z7982 Long term (current) use of aspirin: Secondary | ICD-10-CM | POA: Diagnosis not present

## 2018-12-11 DIAGNOSIS — L89151 Pressure ulcer of sacral region, stage 1: Secondary | ICD-10-CM | POA: Diagnosis not present

## 2018-12-11 DIAGNOSIS — E0781 Sick-euthyroid syndrome: Secondary | ICD-10-CM | POA: Diagnosis not present

## 2018-12-11 DIAGNOSIS — C3411 Malignant neoplasm of upper lobe, right bronchus or lung: Secondary | ICD-10-CM | POA: Diagnosis not present

## 2018-12-11 DIAGNOSIS — I420 Dilated cardiomyopathy: Secondary | ICD-10-CM | POA: Diagnosis not present

## 2018-12-11 DIAGNOSIS — Z885 Allergy status to narcotic agent status: Secondary | ICD-10-CM | POA: Diagnosis not present

## 2018-12-11 DIAGNOSIS — J9621 Acute and chronic respiratory failure with hypoxia: Secondary | ICD-10-CM | POA: Diagnosis not present

## 2018-12-11 DIAGNOSIS — Z880 Allergy status to penicillin: Secondary | ICD-10-CM | POA: Diagnosis not present

## 2018-12-11 DIAGNOSIS — E872 Acidosis: Secondary | ICD-10-CM | POA: Diagnosis not present

## 2018-12-11 DIAGNOSIS — J9622 Acute and chronic respiratory failure with hypercapnia: Secondary | ICD-10-CM | POA: Diagnosis not present

## 2018-12-11 DIAGNOSIS — J441 Chronic obstructive pulmonary disease with (acute) exacerbation: Secondary | ICD-10-CM | POA: Diagnosis not present

## 2018-12-11 DIAGNOSIS — D649 Anemia, unspecified: Secondary | ICD-10-CM | POA: Diagnosis not present

## 2018-12-11 DIAGNOSIS — L89611 Pressure ulcer of right heel, stage 1: Secondary | ICD-10-CM | POA: Diagnosis not present

## 2018-12-11 DIAGNOSIS — J189 Pneumonia, unspecified organism: Secondary | ICD-10-CM | POA: Diagnosis not present

## 2018-12-11 DIAGNOSIS — A4189 Other specified sepsis: Secondary | ICD-10-CM | POA: Diagnosis not present

## 2018-12-11 DIAGNOSIS — J188 Other pneumonia, unspecified organism: Secondary | ICD-10-CM | POA: Diagnosis not present

## 2018-12-11 DIAGNOSIS — I071 Rheumatic tricuspid insufficiency: Secondary | ICD-10-CM | POA: Diagnosis not present

## 2019-01-14 DIAGNOSIS — J449 Chronic obstructive pulmonary disease, unspecified: Secondary | ICD-10-CM | POA: Diagnosis not present

## 2019-03-16 DIAGNOSIS — J449 Chronic obstructive pulmonary disease, unspecified: Secondary | ICD-10-CM | POA: Diagnosis not present

## 2019-04-05 DIAGNOSIS — E785 Hyperlipidemia, unspecified: Secondary | ICD-10-CM | POA: Diagnosis not present

## 2019-04-05 DIAGNOSIS — D649 Anemia, unspecified: Secondary | ICD-10-CM | POA: Diagnosis not present

## 2019-04-05 DIAGNOSIS — J449 Chronic obstructive pulmonary disease, unspecified: Secondary | ICD-10-CM | POA: Diagnosis not present

## 2019-04-15 DIAGNOSIS — J449 Chronic obstructive pulmonary disease, unspecified: Secondary | ICD-10-CM | POA: Diagnosis not present

## 2019-04-28 ENCOUNTER — Other Ambulatory Visit: Payer: Self-pay

## 2019-04-28 DIAGNOSIS — J449 Chronic obstructive pulmonary disease, unspecified: Secondary | ICD-10-CM | POA: Diagnosis not present

## 2019-04-28 DIAGNOSIS — Z0000 Encounter for general adult medical examination without abnormal findings: Secondary | ICD-10-CM | POA: Diagnosis not present

## 2019-04-28 NOTE — Patient Outreach (Signed)
[facility]) Care Management  04/28/2019  LIESA TSAN 1956/06/22 [id number]   Medication Adherence call to Mrs. Anjelica Stemmer [facility] spoke with patient she is showing past due on Rosuvastatin 20 mg patient explain she is taking 1 tablet daily and has medication at this time patient will order it when due patient is showing past due under [facility].   [facility] Direct Dial [phone]  Fax [phone] Said [email or web address]

## 2019-05-16 DIAGNOSIS — J449 Chronic obstructive pulmonary disease, unspecified: Secondary | ICD-10-CM | POA: Diagnosis not present

## 2019-05-30 DIAGNOSIS — Z9981 Dependence on supplemental oxygen: Secondary | ICD-10-CM | POA: Diagnosis not present

## 2019-05-30 DIAGNOSIS — J449 Chronic obstructive pulmonary disease, unspecified: Secondary | ICD-10-CM | POA: Diagnosis not present

## 2019-06-05 DIAGNOSIS — Z1211 Encounter for screening for malignant neoplasm of colon: Secondary | ICD-10-CM | POA: Diagnosis not present

## 2019-06-05 DIAGNOSIS — Z1212 Encounter for screening for malignant neoplasm of rectum: Secondary | ICD-10-CM | POA: Diagnosis not present

## 2019-06-16 DIAGNOSIS — J449 Chronic obstructive pulmonary disease, unspecified: Secondary | ICD-10-CM | POA: Diagnosis not present

## 2019-07-16 DIAGNOSIS — J449 Chronic obstructive pulmonary disease, unspecified: Secondary | ICD-10-CM | POA: Diagnosis not present

## 2019-08-16 DIAGNOSIS — J449 Chronic obstructive pulmonary disease, unspecified: Secondary | ICD-10-CM | POA: Diagnosis not present

## 2019-08-31 DIAGNOSIS — I10 Essential (primary) hypertension: Secondary | ICD-10-CM | POA: Diagnosis not present

## 2019-08-31 DIAGNOSIS — J449 Chronic obstructive pulmonary disease, unspecified: Secondary | ICD-10-CM | POA: Diagnosis not present

## 2019-09-15 DIAGNOSIS — J449 Chronic obstructive pulmonary disease, unspecified: Secondary | ICD-10-CM | POA: Diagnosis not present

## 2019-10-03 DIAGNOSIS — U071 COVID-19: Secondary | ICD-10-CM | POA: Diagnosis not present

## 2019-11-12 DIAGNOSIS — S52121A Displaced fracture of head of right radius, initial encounter for closed fracture: Secondary | ICD-10-CM | POA: Diagnosis not present

## 2019-11-12 DIAGNOSIS — M25531 Pain in right wrist: Secondary | ICD-10-CM | POA: Diagnosis not present

## 2019-11-16 DIAGNOSIS — J449 Chronic obstructive pulmonary disease, unspecified: Secondary | ICD-10-CM | POA: Diagnosis not present

## 2019-12-02 DIAGNOSIS — Z9981 Dependence on supplemental oxygen: Secondary | ICD-10-CM | POA: Diagnosis not present

## 2019-12-02 DIAGNOSIS — J99 Respiratory disorders in diseases classified elsewhere: Secondary | ICD-10-CM | POA: Diagnosis not present

## 2019-12-02 DIAGNOSIS — J029 Acute pharyngitis, unspecified: Secondary | ICD-10-CM | POA: Diagnosis not present

## 2019-12-04 DIAGNOSIS — I10 Essential (primary) hypertension: Secondary | ICD-10-CM | POA: Diagnosis not present

## 2019-12-04 DIAGNOSIS — E785 Hyperlipidemia, unspecified: Secondary | ICD-10-CM | POA: Diagnosis not present

## 2019-12-08 ENCOUNTER — Ambulatory Visit: Payer: Medicare Other | Admitting: Orthopaedic Surgery

## 2019-12-14 DIAGNOSIS — J449 Chronic obstructive pulmonary disease, unspecified: Secondary | ICD-10-CM | POA: Diagnosis not present

## 2019-12-19 DIAGNOSIS — S52551A Other extraarticular fracture of lower end of right radius, initial encounter for closed fracture: Secondary | ICD-10-CM | POA: Diagnosis not present

## 2020-01-04 DIAGNOSIS — E785 Hyperlipidemia, unspecified: Secondary | ICD-10-CM | POA: Diagnosis not present

## 2020-01-04 DIAGNOSIS — I10 Essential (primary) hypertension: Secondary | ICD-10-CM | POA: Diagnosis not present

## 2020-01-14 DIAGNOSIS — J449 Chronic obstructive pulmonary disease, unspecified: Secondary | ICD-10-CM | POA: Diagnosis not present

## 2020-02-03 DIAGNOSIS — I10 Essential (primary) hypertension: Secondary | ICD-10-CM | POA: Diagnosis not present

## 2020-02-03 DIAGNOSIS — F064 Anxiety disorder due to known physiological condition: Secondary | ICD-10-CM | POA: Diagnosis not present

## 2020-02-03 DIAGNOSIS — E785 Hyperlipidemia, unspecified: Secondary | ICD-10-CM | POA: Diagnosis not present

## 2020-02-09 DIAGNOSIS — I10 Essential (primary) hypertension: Secondary | ICD-10-CM | POA: Diagnosis not present

## 2020-02-09 DIAGNOSIS — J449 Chronic obstructive pulmonary disease, unspecified: Secondary | ICD-10-CM | POA: Diagnosis not present

## 2020-02-13 DIAGNOSIS — J449 Chronic obstructive pulmonary disease, unspecified: Secondary | ICD-10-CM | POA: Diagnosis not present

## 2020-03-15 DIAGNOSIS — J449 Chronic obstructive pulmonary disease, unspecified: Secondary | ICD-10-CM | POA: Diagnosis not present

## 2020-04-14 DIAGNOSIS — J449 Chronic obstructive pulmonary disease, unspecified: Secondary | ICD-10-CM | POA: Diagnosis not present

## 2020-05-04 DIAGNOSIS — E785 Hyperlipidemia, unspecified: Secondary | ICD-10-CM | POA: Diagnosis not present

## 2020-05-04 DIAGNOSIS — E064 Drug-induced thyroiditis: Secondary | ICD-10-CM | POA: Diagnosis not present

## 2020-05-04 DIAGNOSIS — I10 Essential (primary) hypertension: Secondary | ICD-10-CM | POA: Diagnosis not present

## 2020-05-10 DIAGNOSIS — F064 Anxiety disorder due to known physiological condition: Secondary | ICD-10-CM | POA: Diagnosis not present

## 2020-05-10 DIAGNOSIS — I10 Essential (primary) hypertension: Secondary | ICD-10-CM | POA: Diagnosis not present

## 2020-05-10 DIAGNOSIS — Z9981 Dependence on supplemental oxygen: Secondary | ICD-10-CM | POA: Diagnosis not present

## 2020-05-15 DIAGNOSIS — J449 Chronic obstructive pulmonary disease, unspecified: Secondary | ICD-10-CM | POA: Diagnosis not present

## 2020-06-05 DIAGNOSIS — F064 Anxiety disorder due to known physiological condition: Secondary | ICD-10-CM | POA: Diagnosis not present

## 2020-06-05 DIAGNOSIS — E785 Hyperlipidemia, unspecified: Secondary | ICD-10-CM | POA: Diagnosis not present

## 2020-06-05 DIAGNOSIS — I10 Essential (primary) hypertension: Secondary | ICD-10-CM | POA: Diagnosis not present

## 2020-06-07 DIAGNOSIS — Z9981 Dependence on supplemental oxygen: Secondary | ICD-10-CM | POA: Diagnosis not present

## 2020-06-15 DIAGNOSIS — J449 Chronic obstructive pulmonary disease, unspecified: Secondary | ICD-10-CM | POA: Diagnosis not present

## 2020-07-15 DIAGNOSIS — J449 Chronic obstructive pulmonary disease, unspecified: Secondary | ICD-10-CM | POA: Diagnosis not present

## 2020-08-15 DIAGNOSIS — J449 Chronic obstructive pulmonary disease, unspecified: Secondary | ICD-10-CM | POA: Diagnosis not present

## 2020-09-04 DIAGNOSIS — E785 Hyperlipidemia, unspecified: Secondary | ICD-10-CM | POA: Diagnosis not present

## 2020-09-04 DIAGNOSIS — I10 Essential (primary) hypertension: Secondary | ICD-10-CM | POA: Diagnosis not present

## 2020-09-04 DIAGNOSIS — E064 Drug-induced thyroiditis: Secondary | ICD-10-CM | POA: Diagnosis not present

## 2020-09-14 DIAGNOSIS — J449 Chronic obstructive pulmonary disease, unspecified: Secondary | ICD-10-CM | POA: Diagnosis not present

## 2020-09-27 DIAGNOSIS — M129 Arthropathy, unspecified: Secondary | ICD-10-CM | POA: Diagnosis not present

## 2020-09-27 DIAGNOSIS — J449 Chronic obstructive pulmonary disease, unspecified: Secondary | ICD-10-CM | POA: Diagnosis not present

## 2020-09-27 DIAGNOSIS — J45909 Unspecified asthma, uncomplicated: Secondary | ICD-10-CM | POA: Diagnosis not present

## 2020-09-27 DIAGNOSIS — I10 Essential (primary) hypertension: Secondary | ICD-10-CM | POA: Diagnosis not present

## 2020-10-15 DIAGNOSIS — J449 Chronic obstructive pulmonary disease, unspecified: Secondary | ICD-10-CM | POA: Diagnosis not present

## 2020-10-28 DIAGNOSIS — J45909 Unspecified asthma, uncomplicated: Secondary | ICD-10-CM | POA: Diagnosis not present

## 2020-10-28 DIAGNOSIS — I10 Essential (primary) hypertension: Secondary | ICD-10-CM | POA: Diagnosis not present

## 2020-11-03 DIAGNOSIS — E785 Hyperlipidemia, unspecified: Secondary | ICD-10-CM | POA: Diagnosis not present

## 2020-11-03 DIAGNOSIS — E064 Drug-induced thyroiditis: Secondary | ICD-10-CM | POA: Diagnosis not present

## 2020-11-03 DIAGNOSIS — I10 Essential (primary) hypertension: Secondary | ICD-10-CM | POA: Diagnosis not present

## 2020-11-05 DIAGNOSIS — I10 Essential (primary) hypertension: Secondary | ICD-10-CM | POA: Diagnosis not present

## 2020-11-05 DIAGNOSIS — E064 Drug-induced thyroiditis: Secondary | ICD-10-CM | POA: Diagnosis not present

## 2020-11-05 DIAGNOSIS — E785 Hyperlipidemia, unspecified: Secondary | ICD-10-CM | POA: Diagnosis not present

## 2020-11-15 DIAGNOSIS — J449 Chronic obstructive pulmonary disease, unspecified: Secondary | ICD-10-CM | POA: Diagnosis not present

## 2020-11-28 DIAGNOSIS — J449 Chronic obstructive pulmonary disease, unspecified: Secondary | ICD-10-CM | POA: Diagnosis not present

## 2020-11-28 DIAGNOSIS — I10 Essential (primary) hypertension: Secondary | ICD-10-CM | POA: Diagnosis not present

## 2020-12-13 DIAGNOSIS — J449 Chronic obstructive pulmonary disease, unspecified: Secondary | ICD-10-CM | POA: Diagnosis not present

## 2020-12-26 DIAGNOSIS — I10 Essential (primary) hypertension: Secondary | ICD-10-CM | POA: Diagnosis not present

## 2020-12-26 DIAGNOSIS — J449 Chronic obstructive pulmonary disease, unspecified: Secondary | ICD-10-CM | POA: Diagnosis not present

## 2021-01-13 DIAGNOSIS — J449 Chronic obstructive pulmonary disease, unspecified: Secondary | ICD-10-CM | POA: Diagnosis not present

## 2021-01-26 DIAGNOSIS — I10 Essential (primary) hypertension: Secondary | ICD-10-CM | POA: Diagnosis not present

## 2021-01-26 DIAGNOSIS — J45909 Unspecified asthma, uncomplicated: Secondary | ICD-10-CM | POA: Diagnosis not present

## 2021-02-12 DIAGNOSIS — J449 Chronic obstructive pulmonary disease, unspecified: Secondary | ICD-10-CM | POA: Diagnosis not present

## 2021-03-05 DIAGNOSIS — E064 Drug-induced thyroiditis: Secondary | ICD-10-CM | POA: Diagnosis not present

## 2021-03-05 DIAGNOSIS — E785 Hyperlipidemia, unspecified: Secondary | ICD-10-CM | POA: Diagnosis not present

## 2021-03-05 DIAGNOSIS — I10 Essential (primary) hypertension: Secondary | ICD-10-CM | POA: Diagnosis not present

## 2021-03-15 DIAGNOSIS — J449 Chronic obstructive pulmonary disease, unspecified: Secondary | ICD-10-CM | POA: Diagnosis not present

## 2021-03-16 DIAGNOSIS — J449 Chronic obstructive pulmonary disease, unspecified: Secondary | ICD-10-CM | POA: Insufficient documentation

## 2021-03-16 DIAGNOSIS — R0600 Dyspnea, unspecified: Secondary | ICD-10-CM | POA: Diagnosis not present

## 2021-03-16 DIAGNOSIS — D649 Anemia, unspecified: Secondary | ICD-10-CM | POA: Diagnosis not present

## 2021-03-16 DIAGNOSIS — I429 Cardiomyopathy, unspecified: Secondary | ICD-10-CM | POA: Diagnosis not present

## 2021-03-16 DIAGNOSIS — E8809 Other disorders of plasma-protein metabolism, not elsewhere classified: Secondary | ICD-10-CM | POA: Insufficient documentation

## 2021-03-16 DIAGNOSIS — R0609 Other forms of dyspnea: Secondary | ICD-10-CM | POA: Insufficient documentation

## 2021-03-16 DIAGNOSIS — I10 Essential (primary) hypertension: Secondary | ICD-10-CM | POA: Diagnosis not present

## 2021-03-18 DIAGNOSIS — D649 Anemia, unspecified: Secondary | ICD-10-CM | POA: Diagnosis not present

## 2021-03-18 DIAGNOSIS — R7303 Prediabetes: Secondary | ICD-10-CM | POA: Diagnosis not present

## 2021-03-18 DIAGNOSIS — I10 Essential (primary) hypertension: Secondary | ICD-10-CM | POA: Diagnosis not present

## 2021-03-18 DIAGNOSIS — R0600 Dyspnea, unspecified: Secondary | ICD-10-CM | POA: Diagnosis not present

## 2021-03-18 DIAGNOSIS — E8809 Other disorders of plasma-protein metabolism, not elsewhere classified: Secondary | ICD-10-CM | POA: Diagnosis not present

## 2021-03-18 DIAGNOSIS — I420 Dilated cardiomyopathy: Secondary | ICD-10-CM | POA: Diagnosis not present

## 2021-03-18 DIAGNOSIS — I429 Cardiomyopathy, unspecified: Secondary | ICD-10-CM | POA: Diagnosis not present

## 2021-03-18 DIAGNOSIS — Z0189 Encounter for other specified special examinations: Secondary | ICD-10-CM | POA: Diagnosis not present

## 2021-03-18 DIAGNOSIS — J449 Chronic obstructive pulmonary disease, unspecified: Secondary | ICD-10-CM | POA: Diagnosis not present

## 2021-04-01 DIAGNOSIS — I428 Other cardiomyopathies: Secondary | ICD-10-CM | POA: Diagnosis not present

## 2021-04-01 DIAGNOSIS — I771 Stricture of artery: Secondary | ICD-10-CM | POA: Diagnosis not present

## 2021-04-01 DIAGNOSIS — J449 Chronic obstructive pulmonary disease, unspecified: Secondary | ICD-10-CM | POA: Diagnosis not present

## 2021-04-01 DIAGNOSIS — E8809 Other disorders of plasma-protein metabolism, not elsewhere classified: Secondary | ICD-10-CM | POA: Diagnosis not present

## 2021-04-01 DIAGNOSIS — D649 Anemia, unspecified: Secondary | ICD-10-CM | POA: Diagnosis not present

## 2021-04-01 DIAGNOSIS — I10 Essential (primary) hypertension: Secondary | ICD-10-CM | POA: Diagnosis not present

## 2021-04-01 DIAGNOSIS — Z87891 Personal history of nicotine dependence: Secondary | ICD-10-CM | POA: Diagnosis not present

## 2021-04-01 DIAGNOSIS — R0602 Shortness of breath: Secondary | ICD-10-CM | POA: Diagnosis not present

## 2021-04-01 DIAGNOSIS — Z95818 Presence of other cardiac implants and grafts: Secondary | ICD-10-CM | POA: Diagnosis not present

## 2021-04-01 DIAGNOSIS — I272 Pulmonary hypertension, unspecified: Secondary | ICD-10-CM | POA: Diagnosis not present

## 2021-04-01 DIAGNOSIS — I429 Cardiomyopathy, unspecified: Secondary | ICD-10-CM | POA: Diagnosis not present

## 2021-04-01 DIAGNOSIS — R0789 Other chest pain: Secondary | ICD-10-CM | POA: Diagnosis not present

## 2021-04-14 DIAGNOSIS — J449 Chronic obstructive pulmonary disease, unspecified: Secondary | ICD-10-CM | POA: Diagnosis not present

## 2021-04-22 DIAGNOSIS — R0609 Other forms of dyspnea: Secondary | ICD-10-CM | POA: Diagnosis not present

## 2021-04-22 DIAGNOSIS — J449 Chronic obstructive pulmonary disease, unspecified: Secondary | ICD-10-CM | POA: Diagnosis not present

## 2021-04-22 DIAGNOSIS — I10 Essential (primary) hypertension: Secondary | ICD-10-CM | POA: Diagnosis not present

## 2021-04-22 DIAGNOSIS — I428 Other cardiomyopathies: Secondary | ICD-10-CM | POA: Diagnosis not present

## 2021-05-15 DIAGNOSIS — J449 Chronic obstructive pulmonary disease, unspecified: Secondary | ICD-10-CM | POA: Diagnosis not present

## 2021-06-15 DIAGNOSIS — J449 Chronic obstructive pulmonary disease, unspecified: Secondary | ICD-10-CM | POA: Diagnosis not present

## 2021-06-28 DIAGNOSIS — Z880 Allergy status to penicillin: Secondary | ICD-10-CM | POA: Diagnosis not present

## 2021-06-28 DIAGNOSIS — J479 Bronchiectasis, uncomplicated: Secondary | ICD-10-CM | POA: Diagnosis not present

## 2021-06-28 DIAGNOSIS — R55 Syncope and collapse: Secondary | ICD-10-CM | POA: Diagnosis not present

## 2021-06-28 DIAGNOSIS — R4182 Altered mental status, unspecified: Secondary | ICD-10-CM | POA: Diagnosis not present

## 2021-06-28 DIAGNOSIS — J449 Chronic obstructive pulmonary disease, unspecified: Secondary | ICD-10-CM | POA: Diagnosis not present

## 2021-06-28 DIAGNOSIS — Z20822 Contact with and (suspected) exposure to covid-19: Secondary | ICD-10-CM | POA: Diagnosis not present

## 2021-06-28 DIAGNOSIS — Z885 Allergy status to narcotic agent status: Secondary | ICD-10-CM | POA: Diagnosis not present

## 2021-06-28 DIAGNOSIS — J441 Chronic obstructive pulmonary disease with (acute) exacerbation: Secondary | ICD-10-CM | POA: Diagnosis not present

## 2021-07-15 DIAGNOSIS — J449 Chronic obstructive pulmonary disease, unspecified: Secondary | ICD-10-CM | POA: Diagnosis not present

## 2021-07-29 DIAGNOSIS — J449 Chronic obstructive pulmonary disease, unspecified: Secondary | ICD-10-CM | POA: Diagnosis not present

## 2021-08-15 DIAGNOSIS — J449 Chronic obstructive pulmonary disease, unspecified: Secondary | ICD-10-CM | POA: Diagnosis not present

## 2021-11-11 DIAGNOSIS — I10 Essential (primary) hypertension: Secondary | ICD-10-CM | POA: Diagnosis not present

## 2021-11-11 DIAGNOSIS — Z1331 Encounter for screening for depression: Secondary | ICD-10-CM | POA: Diagnosis not present

## 2021-11-11 DIAGNOSIS — J449 Chronic obstructive pulmonary disease, unspecified: Secondary | ICD-10-CM | POA: Diagnosis not present

## 2021-11-11 DIAGNOSIS — Z1389 Encounter for screening for other disorder: Secondary | ICD-10-CM | POA: Diagnosis not present

## 2021-11-11 DIAGNOSIS — Z23 Encounter for immunization: Secondary | ICD-10-CM | POA: Diagnosis not present

## 2021-11-11 DIAGNOSIS — M129 Arthropathy, unspecified: Secondary | ICD-10-CM | POA: Diagnosis not present

## 2021-11-11 DIAGNOSIS — Z0001 Encounter for general adult medical examination with abnormal findings: Secondary | ICD-10-CM | POA: Diagnosis not present

## 2022-05-26 DIAGNOSIS — D72825 Bandemia: Secondary | ICD-10-CM | POA: Insufficient documentation

## 2022-05-26 DIAGNOSIS — R64 Cachexia: Secondary | ICD-10-CM | POA: Diagnosis not present

## 2022-05-26 DIAGNOSIS — Z9981 Dependence on supplemental oxygen: Secondary | ICD-10-CM | POA: Diagnosis not present

## 2022-05-26 DIAGNOSIS — Z792 Long term (current) use of antibiotics: Secondary | ICD-10-CM | POA: Diagnosis not present

## 2022-05-26 DIAGNOSIS — I10 Essential (primary) hypertension: Secondary | ICD-10-CM | POA: Diagnosis not present

## 2022-05-26 DIAGNOSIS — I959 Hypotension, unspecified: Secondary | ICD-10-CM | POA: Diagnosis not present

## 2022-05-26 DIAGNOSIS — R0902 Hypoxemia: Secondary | ICD-10-CM | POA: Insufficient documentation

## 2022-05-26 DIAGNOSIS — R0602 Shortness of breath: Secondary | ICD-10-CM | POA: Diagnosis not present

## 2022-05-26 DIAGNOSIS — I110 Hypertensive heart disease with heart failure: Secondary | ICD-10-CM | POA: Diagnosis not present

## 2022-05-26 DIAGNOSIS — Z7901 Long term (current) use of anticoagulants: Secondary | ICD-10-CM | POA: Diagnosis not present

## 2022-05-26 DIAGNOSIS — Z681 Body mass index (BMI) 19 or less, adult: Secondary | ICD-10-CM | POA: Diagnosis not present

## 2022-05-26 DIAGNOSIS — J9621 Acute and chronic respiratory failure with hypoxia: Secondary | ICD-10-CM | POA: Diagnosis not present

## 2022-05-26 DIAGNOSIS — J441 Chronic obstructive pulmonary disease with (acute) exacerbation: Secondary | ICD-10-CM | POA: Diagnosis not present

## 2022-05-26 DIAGNOSIS — R0689 Other abnormalities of breathing: Secondary | ICD-10-CM | POA: Insufficient documentation

## 2022-05-26 DIAGNOSIS — Z8709 Personal history of other diseases of the respiratory system: Secondary | ICD-10-CM | POA: Diagnosis not present

## 2022-05-26 DIAGNOSIS — I509 Heart failure, unspecified: Secondary | ICD-10-CM | POA: Diagnosis not present

## 2022-05-26 DIAGNOSIS — Z20822 Contact with and (suspected) exposure to covid-19: Secondary | ICD-10-CM | POA: Diagnosis not present

## 2022-05-26 DIAGNOSIS — F419 Anxiety disorder, unspecified: Secondary | ICD-10-CM | POA: Diagnosis not present

## 2022-05-26 DIAGNOSIS — Z79899 Other long term (current) drug therapy: Secondary | ICD-10-CM | POA: Diagnosis not present

## 2022-05-26 DIAGNOSIS — Z8679 Personal history of other diseases of the circulatory system: Secondary | ICD-10-CM | POA: Diagnosis not present

## 2022-05-26 DIAGNOSIS — D72829 Elevated white blood cell count, unspecified: Secondary | ICD-10-CM | POA: Diagnosis not present

## 2022-05-26 DIAGNOSIS — D649 Anemia, unspecified: Secondary | ICD-10-CM | POA: Diagnosis not present

## 2022-05-26 DIAGNOSIS — J9622 Acute and chronic respiratory failure with hypercapnia: Secondary | ICD-10-CM | POA: Diagnosis not present

## 2022-05-26 DIAGNOSIS — E43 Unspecified severe protein-calorie malnutrition: Secondary | ICD-10-CM | POA: Diagnosis not present

## 2022-05-26 DIAGNOSIS — Z9989 Dependence on other enabling machines and devices: Secondary | ICD-10-CM | POA: Diagnosis not present

## 2022-05-26 DIAGNOSIS — I340 Nonrheumatic mitral (valve) insufficiency: Secondary | ICD-10-CM | POA: Diagnosis not present

## 2022-05-26 DIAGNOSIS — R079 Chest pain, unspecified: Secondary | ICD-10-CM | POA: Diagnosis not present

## 2022-05-27 DIAGNOSIS — R64 Cachexia: Secondary | ICD-10-CM | POA: Insufficient documentation

## 2022-05-27 DIAGNOSIS — Z9981 Dependence on supplemental oxygen: Secondary | ICD-10-CM | POA: Insufficient documentation

## 2022-05-28 DIAGNOSIS — E43 Unspecified severe protein-calorie malnutrition: Secondary | ICD-10-CM | POA: Insufficient documentation

## 2022-05-29 DIAGNOSIS — J9620 Acute and chronic respiratory failure, unspecified whether with hypoxia or hypercapnia: Secondary | ICD-10-CM | POA: Insufficient documentation

## 2022-06-13 DIAGNOSIS — R079 Chest pain, unspecified: Secondary | ICD-10-CM | POA: Diagnosis not present

## 2022-07-01 ENCOUNTER — Institutional Professional Consult (permissible substitution): Payer: 59 | Admitting: Internal Medicine

## 2022-07-01 NOTE — Progress Notes (Deleted)
   Lindsey Morton, female    DOB: [birth year]    MRN: [id number]   Brief patient profile:  ***  yo*** *** referred to pulmonary clinic in [place]  07/01/2022 by *** for ***      History of Present Illness  07/01/2022  Pulmonary/ 1st office eval/ Melvyn Novas / [place] [facility]  No chief complaint on file.    Dyspnea:  *** Cough: *** Sleep: *** SABA use:   Past Medical History:  Diagnosis Date   Arthritis    Asthma    COPD (chronic obstructive pulmonary disease) ([facility])    DDD (degenerative disc disease)    DDD (degenerative disc disease)    Depression    Emphysema    Osteoarthritis    Osteoporosis    Panic attack     Outpatient Medications Prior to Visit  Medication Sig Dispense Refill   albuterol (PROVENTIL HFA;VENTOLIN HFA) 108 (90 BASE) MCG/ACT inhaler Inhale 2 puffs into the lungs every 6 (six) hours as needed. Shortness of Breath     albuterol (PROVENTIL) (2.5 MG/3ML) 0.083% nebulizer solution Take 2.5 mg by nebulization every 6 (six) hours as needed. Shortness of Breath     ALPRAZolam (XANAX) 1 MG tablet Take 1 mg by mouth at bedtime as needed for anxiety or sleep.      aspirin 81 MG tablet Take 81 mg by mouth daily.       doxycycline (VIBRAMYCIN) 100 MG capsule Take 1 capsule (100 mg total) by mouth 2 (two) times daily. 20 capsule 0   levofloxacin (LEVAQUIN) 750 MG tablet Take 1 tablet (750 mg total) by mouth daily. (Patient not taking: Reported on 06/02/2016) 7 tablet 0   Multiple Vitamin (MULTIVITAMIN) tablet Take 1 tablet by mouth daily.       predniSONE (DELTASONE) 20 MG tablet Take 2 tablets (40 mg total) by mouth daily. (Patient not taking: Reported on 06/02/2016) 10 tablet 0   No facility-administered medications prior to visit.     Objective:     There were no vitals taken for this visit.         Assessment   No problem-specific Assessment & Plan notes found for this encounter.     Christinia Gully, MD 07/01/2022

## 2022-07-11 DIAGNOSIS — J441 Chronic obstructive pulmonary disease with (acute) exacerbation: Secondary | ICD-10-CM | POA: Diagnosis not present

## 2022-10-15 DIAGNOSIS — J449 Chronic obstructive pulmonary disease, unspecified: Secondary | ICD-10-CM | POA: Diagnosis not present

## 2022-10-29 DIAGNOSIS — J449 Chronic obstructive pulmonary disease, unspecified: Secondary | ICD-10-CM | POA: Diagnosis not present

## 2022-10-29 DIAGNOSIS — J9610 Chronic respiratory failure, unspecified whether with hypoxia or hypercapnia: Secondary | ICD-10-CM | POA: Diagnosis not present

## 2022-11-03 ENCOUNTER — Institutional Professional Consult (permissible substitution): Payer: 59 | Admitting: Internal Medicine

## 2022-11-03 NOTE — Progress Notes (Deleted)
   Lindsey Morton, female    DOB: 1956/03/16    MRN: [id number]   Brief patient profile:  67  yo*** *** referred to pulmonary clinic in [place]  11/03/2022 by *** for ***    History of Present Illness  11/03/2022  Pulmonary/ 1st office eval/ Melvyn Novas / [place] [facility]  No chief complaint on file.    Dyspnea:  *** Cough: *** Sleep: *** SABA use: *** 02: *** Lung cancer screen: ***  Past Medical History:  Diagnosis Date   Arthritis    Asthma    COPD (chronic obstructive pulmonary disease) (H[facility])    DDD (degenerative disc disease)    DDD (degenerative disc disease)    Depression    Emphysema    Osteoarthritis    Osteoporosis    Panic attack     Outpatient Medications Prior to Visit  Medication Sig Dispense Refill   albuterol (PROVENTIL HFA;VENTOLIN HFA) 108 (90 BASE) MCG/ACT inhaler Inhale 2 puffs into the lungs every 6 (six) hours as needed. Shortness of Breath     albuterol (PROVENTIL) (2.5 MG/3ML) 0.083% nebulizer solution Take 2.5 mg by nebulization every 6 (six) hours as needed. Shortness of Breath     ALPRAZolam (XANAX) 1 MG tablet Take 1 mg by mouth at bedtime as needed for anxiety or sleep.      aspirin 81 MG tablet Take 81 mg by mouth daily.       doxycycline (VIBRAMYCIN) 100 MG capsule Take 1 capsule (100 mg total) by mouth 2 (two) times daily. 20 capsule 0   levofloxacin (LEVAQUIN) 750 MG tablet Take 1 tablet (750 mg total) by mouth daily. (Patient not taking: Reported on 06/02/2016) 7 tablet 0   Multiple Vitamin (MULTIVITAMIN) tablet Take 1 tablet by mouth daily.       predniSONE (DELTASONE) 20 MG tablet Take 2 tablets (40 mg total) by mouth daily. (Patient not taking: Reported on 06/02/2016) 10 tablet 0   No facility-administered medications prior to visit.     Objective:     There were no vitals taken for this visit.         Assessment   No problem-specific Assessment & Plan notes found for this encounter.     Christinia Gully, MD 11/03/2022

## 2022-11-15 DIAGNOSIS — J449 Chronic obstructive pulmonary disease, unspecified: Secondary | ICD-10-CM | POA: Diagnosis not present

## 2022-11-29 DIAGNOSIS — J9610 Chronic respiratory failure, unspecified whether with hypoxia or hypercapnia: Secondary | ICD-10-CM | POA: Diagnosis not present

## 2022-11-29 DIAGNOSIS — J449 Chronic obstructive pulmonary disease, unspecified: Secondary | ICD-10-CM | POA: Diagnosis not present

## 2022-12-04 DIAGNOSIS — J449 Chronic obstructive pulmonary disease, unspecified: Secondary | ICD-10-CM | POA: Diagnosis not present

## 2022-12-04 DIAGNOSIS — R531 Weakness: Secondary | ICD-10-CM | POA: Diagnosis not present

## 2022-12-14 DIAGNOSIS — J449 Chronic obstructive pulmonary disease, unspecified: Secondary | ICD-10-CM | POA: Diagnosis not present

## 2022-12-28 DIAGNOSIS — J449 Chronic obstructive pulmonary disease, unspecified: Secondary | ICD-10-CM | POA: Diagnosis not present

## 2022-12-28 DIAGNOSIS — J9610 Chronic respiratory failure, unspecified whether with hypoxia or hypercapnia: Secondary | ICD-10-CM | POA: Diagnosis not present

## 2023-01-28 DIAGNOSIS — J449 Chronic obstructive pulmonary disease, unspecified: Secondary | ICD-10-CM | POA: Diagnosis not present

## 2023-01-28 DIAGNOSIS — J9610 Chronic respiratory failure, unspecified whether with hypoxia or hypercapnia: Secondary | ICD-10-CM | POA: Diagnosis not present

## 2023-03-23 ENCOUNTER — Institutional Professional Consult (permissible substitution): Payer: 59 | Admitting: Internal Medicine

## 2023-03-23 NOTE — Progress Notes (Deleted)
   Lindsey Morton, female    DOB: November 10, 1955    MRN: [id number]   Brief patient profile:  67  yo*** *** referred to pulmonary clinic in [place]  03/23/2023 by *** for ***    Carries dx of  copd/ no PFTs on file in EPIC and  ronchiectasis? MAI 06/02/16 CTa chest   History of Present Illness  03/23/2023  Pulmonary/ 1st office eval/ Tiras Bianchini / [place] [facility]  No chief complaint on file.    Dyspnea:  *** Cough: *** Sleep: *** SABA use: *** 02: *** Lung cancer screen: ***  Past Medical History:  Diagnosis Date   Arthritis    Asthma    COPD (chronic obstructive pulmonary disease) (HCC)    DDD (degenerative disc disease)    DDD (degenerative disc disease)    Depression    Emphysema    Osteoarthritis    Osteoporosis    Panic attack     Outpatient Medications Prior to Visit  Medication Sig Dispense Refill   albuterol (PROVENTIL HFA;VENTOLIN HFA) 108 (90 BASE) MCG/ACT inhaler Inhale 2 puffs into the lungs every 6 (six) hours as needed. Shortness of Breath     albuterol (PROVENTIL) (2.5 MG/3ML) 0.083% nebulizer solution Take 2.5 mg by nebulization every 6 (six) hours as needed. Shortness of Breath     ALPRAZolam (XANAX) 1 MG tablet Take 1 mg by mouth at bedtime as needed for anxiety or sleep.      aspirin 81 MG tablet Take 81 mg by mouth daily.       doxycycline (VIBRAMYCIN) 100 MG capsule Take 1 capsule (100 mg total) by mouth 2 (two) times daily. 20 capsule 0   levofloxacin (LEVAQUIN) 750 MG tablet Take 1 tablet (750 mg total) by mouth daily. (Patient not taking: Reported on 06/02/2016) 7 tablet 0   Multiple Vitamin (MULTIVITAMIN) tablet Take 1 tablet by mouth daily.       predniSONE (DELTASONE) 20 MG tablet Take 2 tablets (40 mg total) by mouth daily. (Patient not taking: Reported on 06/02/2016) 10 tablet 0   No facility-administered medications prior to visit.     Objective:     There were no vitals taken for this visit.         Assessment   No problem-specific  Assessment & Plan notes found for this encounter.     Sandrea Hughs, MD 03/23/2023

## 2023-04-26 NOTE — Progress Notes (Unsigned)
Lindsey Morton, female    DOB: 01-Aug-1956    MRN: [id number]   Brief patient profile:  67  yowf  asthma as child on prn inhalers and noted doe initially by the time d/c smoking 2020 needed  24 h 02  referred to pulmonary clinic in [place]  04/27/2023 by Dr Felecia Shelling  for COPD eval / no pfts on file in EPIC      History of Present Illness  04/27/2023  Pulmonary/ 1st office eval/ Sherene Sires / [facility]  Chief Complaint  Patient presents with   COPD  Dyspnea:  mb 200 ft slt uphill but avoids walking there due to doe off 02  / rides buggy at grocery store s 02  Cough: none  Sleep:flat bed/ 2 pillows no resp cc  Saba q am and prn daytime 02: 2 lpm NP hs / prn daytime  Lung cancer screen: referred7/22/2024   No obvious day to day or daytime pattern/variability or assoc excess/ purulent sputum or mucus plugs or hemoptysis or cp or chest tightness, subjective wheeze or overt sinus or hb symptoms.   Sleeping  without nocturnal  or early am exacerbation  of respiratory  c/o's or need for noct saba. Also denies any obvious fluctuation of symptoms with weather or environmental changes or other aggravating or alleviating factors except as outlined above   No unusual exposure hx or h/o childhood pna/ asthma or knowledge of premature birth.  Current Allergies, Complete Past Medical History, Past Surgical History, Family History, and Social History were reviewed in [employer] record.  ROS  The following are not active complaints unless bolded Hoarseness, sore throat, dysphagia, dental problems, itching, sneezing,  nasal congestion or discharge of excess mucus or purulent secretions, ear ache,   fever, chills, sweats, unintended wt loss or wt gain, classically pleuritic or exertional cp,  orthopnea pnd or arm/hand swelling  or leg swelling, presyncope, palpitations, abdominal pain, anorexia, nausea, vomiting, diarrhea  or change in bowel habits or change in bladder habits,  change in stools or change in urine, dysuria, hematuria,  rash, arthralgias, visual complaints, headache, numbness, weakness or ataxia or problems with walking or coordination,  change in mood or  memory.              Outpatient Medications Prior to Visit  Medication Sig Dispense Refill   aspirin 81 MG tablet Take 81 mg by mouth daily.       Multiple Vitamin (MULTIVITAMIN) tablet Take 1 tablet by mouth daily.       albuterol (PROVENTIL HFA;VENTOLIN HFA) 108 (90 BASE) MCG/ACT inhaler Inhale 2 puffs into the lungs every 6 (six) hours as needed. Shortness of Breath (Patient not taking: Reported on 04/27/2023)     albuterol (PROVENTIL) (2.5 MG/3ML) 0.083% nebulizer solution Take 2.5 mg by nebulization every 6 (six) hours as needed. Shortness of Breath (Patient not taking: Reported on 04/27/2023)     ALPRAZolam (XANAX) 1 MG tablet Take 1 mg by mouth at bedtime as needed for anxiety or sleep.  (Patient not taking: Reported on 04/27/2023)     carvedilol (COREG) 3.125 MG tablet Take 3.125 mg by mouth 2 (two) times daily. (Patient not taking: Reported on 04/27/2023)                0       0       0   No facility-administered medications prior to visit.    Past Medical History:  Diagnosis Date  Arthritis    Asthma    COPD (chronic obstructive pulmonary disease) (HCC)    DDD (degenerative disc disease)    DDD (degenerative disc disease)    Depression    Emphysema    Osteoarthritis    Osteoporosis    Panic attack       Objective:     BP 130/80   Pulse 100   Ht 5\' 7"  (1.702 m)   Wt 92 lb (41.7 kg)   SpO2 98% Comment: 2L C  BMI 14.41 kg/m   SpO2: 98 % (2L C)  Thin anxious amb wf nad  HEENT : Oropharynx  clear      NECK :  without  apparent JVD/ palpable Nodes/TM    LUNGS: no acc muscle use,  Mild barrel  contour chest wall with bilateral  Distant bs s audible wheeze and  without cough on insp or exp maneuvers  and mild  Hyperresonant  to  percussion bilaterally     CV:   RRR  no s3 or murmur or increase in P2, and no edema   ABD:  soft and nontender with pos end  insp Hoover's  in the supine position.  No bruits or organomegaly appreciated   MS:  Nl gait/ ext warm without deformities Or obvious joint restrictions  calf tenderness, cyanosis or clubbing     SKIN: warm and dry without lesions    NEURO:  alert, approp, nl sensorium with  no motor or cerebellar deficits apparent.    Cxr rec 04/27/2023 > declined       Assessment   DOE (dyspnea on exertion) Likely copd plus conditioning   R/o anemia/ thyroid dz/ chf > labs pending   COPD GOLD ? Stopped smoking around 2019/20 and already on 02 at that point  - 04/27/2023  After extensive coaching inhaler device,  effectiveness =    75% dpi > give 6 weeks of trelegy 100 samples and apply to [employer] for more  - Allergy screen 04/27/2023 >  Eos 0. /  Alpha one AT phenotype    Group D (now reclassified as E) in terms of symptom/risk and laba/lama/ICS  therefore appropriate rx at this point >>>  trelegy and more approp saba  Re SABA :  I spent extra time with pt today reviewing appropriate use of albuterol for prn use on exertion with the following points: 1) saba is for relief of sob that does not improve by walking a slower pace or resting but rather if the pt does not improve after trying this first. 2) If the pt is convinced, as many are, that saba helps recover from activity faster then it's easy to tell if this is the case by re-challenging : ie stop, take the inhaler, then p 5 minutes try the exact same activity (intensity of workload) that just caused the symptoms and see if they are substantially diminished or not after saba 3) if there is an activity that reproducibly causes the symptoms, try the saba 15 min before the activity on alternate days   If in fact the saba really does help, then fine to continue to use it prn but advised may need to look closer at the maintenance regimen being used to achieve  better control of airways disease with exertion.   Chronic respiratory failure with hypoxia (HCC) Placed on 02 ? 2019/20 about the time quit smoking   04/27/2023   Walked on RA  x  2   lap(s) =  approx  300  ft  @ nl pace, stopped due to weakness with lowest 02 sats 95%    As of 04/27/2023 use 2lpm and portable prn sats < 90%     Former smoker Low-dose CT lung cancer screening is recommended for patients who are 12-38 years of age with a 20+ pack-year history of smoking and who are currently smoking or quit <=15 years ago. No coughing up blood  No unintentional weight loss of > 15 pounds in the last 6 months - pt is eligible for scanning yearly until 2035 > referred   Discussed in detail all the  indications, usual  risks and alternatives  relative to the benefits with patient who agrees to proceed with w/u as outlined.         Each maintenance medication was reviewed in detail including emphasizing most importantly the difference between maintenance and prns and under what circumstances the prns are to be triggered using an action plan format where appropriate.  Total time for H and P, chart review, counseling, reviewing hfa/dpi/02 neb  device(s) , directly observing portions of ambulatory 02 saturation study/ and generating customized AVS unique to this office visit / same day charting = 61 m new pt eval                         Sandrea Hughs, MD 04/27/2023

## 2023-04-27 ENCOUNTER — Encounter: Payer: Self-pay | Admitting: Internal Medicine

## 2023-04-27 ENCOUNTER — Ambulatory Visit: Payer: Medicare HMO | Admitting: Internal Medicine

## 2023-04-27 VITALS — BP 130/80 | HR 100 | Ht 67.0 in | Wt 92.0 lb

## 2023-04-27 DIAGNOSIS — J449 Chronic obstructive pulmonary disease, unspecified: Secondary | ICD-10-CM

## 2023-04-27 DIAGNOSIS — J9611 Chronic respiratory failure with hypoxia: Secondary | ICD-10-CM | POA: Diagnosis not present

## 2023-04-27 DIAGNOSIS — Z87891 Personal history of nicotine dependence: Secondary | ICD-10-CM

## 2023-04-27 DIAGNOSIS — R0609 Other forms of dyspnea: Secondary | ICD-10-CM

## 2023-04-27 MED ORDER — TRELEGY ELLIPTA 100-62.5-25 MCG/ACT IN AEPB: 1.0000 | INHALATION_SPRAY | Freq: Every morning | RESPIRATORY_TRACT | Status: AC

## 2023-04-27 NOTE — Assessment & Plan Note (Signed)
Low-dose CT lung cancer screening is recommended for patients who are 73-69 years of age with a 20+ pack-year history of smoking and who are currently smoking or quit <=15 years ago. No coughing up blood  No unintentional weight loss of > 15 pounds in the last 6 months - pt is eligible for scanning yearly until 2035 > referred   Discussed in detail all the  indications, usual  risks and alternatives  relative to the benefits with patient who agrees to proceed with w/u as outlined.         Each maintenance medication was reviewed in detail including emphasizing most importantly the difference between maintenance and prns and under what circumstances the prns are to be triggered using an action plan format where appropriate.  Total time for H and P, chart review, counseling, reviewing hfa/dpi/02 neb  device(s) , directly observing portions of ambulatory 02 saturation study/ and generating customized AVS unique to this office visit / same day charting = 61 m new pt eval

## 2023-04-27 NOTE — Assessment & Plan Note (Addendum)
Likely copd plus conditioning   R/o anemia/ thyroid dz/ chf > labs pending

## 2023-04-27 NOTE — Patient Instructions (Addendum)
Plan A = Automatic = Always=    trlegy 100 one click 1st thin in am   Plan B = Backup (to supplement plan A, not to replace it) Only use your albuterol inhaler as a rescue medication to be used if you can't catch your breath by resting or doing a relaxed purse lip breathing pattern.  - The less you use it, the better it will work when you need it. - Ok to use the inhaler up to 2 puffs  every 4 hours if you must but call for appointment if use goes up over your usual need - Don't leave home without it !!  (think of it like the spare tire for your car)   Plan C = Crisis (instead of Plan B but only if Plan B stops working) - only use your albuterol nebulizer if you first try Plan B and it fails to help > ok to use the nebulizer up to every 4 hours but if start needing it regularly call for immediate appointment  Make sure you check your oxygen saturation  AT  your highest level of activity (not after you stop)   to be sure it stays over 90% and adjust  02 flow upward to maintain this level if needed but remember to turn it back to previous settings when you stop (to conserve your supply).    Also  Ok to try albuterol 15 min before an activity (on alternating days)  that you know would usually make you short of breath and see if it makes any difference and if makes none then don't take albuterol after activity unless you can't catch your breath as this means it's the resting that helps, not the albuterol.  My office will be contacting you by phone for referral to lung cancer screening program  - if you don't hear back from my office within one week please call us back or notify us thru MyChart and we'll address it right away.   Please remember to go to the lab department   for your tests - we will call you with the results when they are available.      Please schedule a follow up office visit in 6 weeks, call sooner if needed with all medications /inhalers/ solutions in hand so we can verify  exactly what you are taking. This includes all medications from all doctors and over the counters

## 2023-04-27 NOTE — Assessment & Plan Note (Signed)
Stopped smoking around 2019/20 and already on 02 at that point  - 04/27/2023  After extensive coaching inhaler device,  effectiveness =    75% dpi > give 6 weeks of trelegy 100 samples and apply to [employer] for more  - Allergy screen 04/27/2023 >  Eos 0. /  Alpha one AT phenotype    Group D (now reclassified as E) in terms of symptom/risk and laba/lama/ICS  therefore appropriate rx at this point >>>  trelegy and more approp saba  Re SABA :  I spent extra time with pt today reviewing appropriate use of albuterol for prn use on exertion with the following points: 1) saba is for relief of sob that does not improve by walking a slower pace or resting but rather if the pt does not improve after trying this first. 2) If the pt is convinced, as many are, that saba helps recover from activity faster then it's easy to tell if this is the case by re-challenging : ie stop, take the inhaler, then p 5 minutes try the exact same activity (intensity of workload) that just caused the symptoms and see if they are substantially diminished or not after saba 3) if there is an activity that reproducibly causes the symptoms, try the saba 15 min before the activity on alternate days   If in fact the saba really does help, then fine to continue to use it prn but advised may need to look closer at the maintenance regimen being used to achieve better control of airways disease with exertion.

## 2023-04-27 NOTE — Assessment & Plan Note (Addendum)
Placed on 02 ? 2019/20 about the time quit smoking   04/27/2023   Walked on RA  x  2   lap(s) =  approx 300  ft  @ nl pace, stopped due to weakness with lowest 02 sats 95%    As of 04/27/2023 use 2lpm and portable prn sats < 90%

## 2023-05-20 DIAGNOSIS — F411 Generalized anxiety disorder: Secondary | ICD-10-CM | POA: Diagnosis not present

## 2023-05-20 DIAGNOSIS — Z9981 Dependence on supplemental oxygen: Secondary | ICD-10-CM | POA: Diagnosis not present

## 2023-05-20 DIAGNOSIS — E46 Unspecified protein-calorie malnutrition: Secondary | ICD-10-CM | POA: Diagnosis not present

## 2023-05-20 DIAGNOSIS — J449 Chronic obstructive pulmonary disease, unspecified: Secondary | ICD-10-CM | POA: Diagnosis not present

## 2023-05-20 DIAGNOSIS — Z1331 Encounter for screening for depression: Secondary | ICD-10-CM | POA: Diagnosis not present

## 2023-05-20 DIAGNOSIS — Z0001 Encounter for general adult medical examination with abnormal findings: Secondary | ICD-10-CM | POA: Diagnosis not present

## 2023-05-20 DIAGNOSIS — I10 Essential (primary) hypertension: Secondary | ICD-10-CM | POA: Diagnosis not present

## 2023-05-20 DIAGNOSIS — Z1389 Encounter for screening for other disorder: Secondary | ICD-10-CM | POA: Diagnosis not present

## 2023-05-20 DIAGNOSIS — M129 Arthropathy, unspecified: Secondary | ICD-10-CM | POA: Diagnosis not present

## 2023-06-14 NOTE — Progress Notes (Deleted)
Lindsey Morton, female    DOB: 01/05/1956    MRN: [id number]   Brief patient profile:  59  yowf  asthma as child on prn inhalers and noted doe initially by the time d/c smoking 2020 needed  24 h 02  referred to pulmonary clinic in [place]  04/27/2023 by Dr Felecia Shelling  for COPD eval / no pfts on file in EPIC     History of Present Illness  04/27/2023  Pulmonary/ 1st office eval/ Lindsey Morton / [facility]  Chief Complaint  Patient presents with   COPD  Dyspnea:  mb 200 ft slt uphill but avoids walking there due to doe off 02  / rides buggy at grocery store s 02  Cough: none  Sleep:flat bed/ 2 pillows no resp cc  Saba q am and prn daytime 02: 2 lpm NP hs / prn daytime  Lung cancer screen: referred7/22/2024  Rec Plan A = Automatic = Always=    trlegy 100 one click 1st thin in am  Plan B = Backup (to supplement plan A, not to replace it) Only use your albuterol inhaler as a rescue medication  Plan C = Crisis (instead of Plan B but only if Plan B stops working) - only use your albuterol nebulizer if you first try Plan B Make sure you check your oxygen saturation  AT  your highest level of activity (not after you stop)   to be sure it stays over 90% Also  Ok to try albuterol 15 min before an activity (on alternating days)  that you know would usually make you short of breath  My office will be contacting you by phone for referral to lung cancer screening program  - if you don't hear back from my office within one week please call us back or notify us thru MyChart and we'll address it right away.   Please remember to go to the lab department   for your tests - we will call you with the results when they are available.> did not go as rec       Please schedule a follow up office visit in 6 weeks, call sooner if needed with all medications /inhalers/ solutions in hand     06/15/2023  f/u ov/[place] [facility]/Riyad Keena re: *** maint on *** did *** bring meds  needs to go to lab*** No chief  complaint on file.   Dyspnea:  *** Cough: *** Sleeping: ***   resp cc  SABA use: *** 02: ***  Lung cancer screening: ***   No obvious day to day or daytime variability or assoc excess/ purulent sputum or mucus plugs or hemoptysis or cp or chest tightness, subjective wheeze or overt sinus or hb symptoms.    Also denies any obvious fluctuation of symptoms with weather or environmental changes or other aggravating or alleviating factors except as outlined above   No unusual exposure hx or h/o childhood pna/ asthma or knowledge of premature birth.  Current Allergies, Complete Past Medical History, Past Surgical History, Family History, and Social History were reviewed in [employer] record.  ROS  The following are not active complaints unless bolded Hoarseness, sore throat, dysphagia, dental problems, itching, sneezing,  nasal congestion or discharge of excess mucus or purulent secretions, ear ache,   fever, chills, sweats, unintended wt loss or wt gain, classically pleuritic or exertional cp,  orthopnea pnd or arm/hand swelling  or leg swelling, presyncope, palpitations, abdominal pain, anorexia, nausea, vomiting, diarrhea  or change  in bowel habits or change in bladder habits, change in stools or change in urine, dysuria, hematuria,  rash, arthralgias, visual complaints, headache, numbness, weakness or ataxia or problems with walking or coordination,  change in mood or  memory.        No outpatient medications have been marked as taking for the 06/15/23 encounter (Appointment) with Nyoka Cowden, MD.          Past Medical History:  Diagnosis Date   Arthritis    Asthma    COPD (chronic obstructive pulmonary disease) (HCC)    DDD (degenerative disc disease)    DDD (degenerative disc disease)    Depression    Emphysema    Osteoarthritis    Osteoporosis    Panic attack       Objective:     Wt Readings from Last 3 Encounters:  04/27/23 92 lb (41.7 kg)   06/02/16 101 lb (45.8 kg)  04/12/15 100 lb (45.4 kg)      Vital signs reviewed  06/15/2023  - Note at rest 02 sats  ***% on ***   General appearance:    ***     Mild bar***      Assessment

## 2023-06-15 ENCOUNTER — Ambulatory Visit: Payer: 59 | Admitting: Internal Medicine

## 2023-07-11 DIAGNOSIS — I700 Atherosclerosis of aorta: Secondary | ICD-10-CM | POA: Diagnosis not present

## 2023-07-11 DIAGNOSIS — W06XXXA Fall from bed, initial encounter: Secondary | ICD-10-CM | POA: Diagnosis not present

## 2023-07-11 DIAGNOSIS — J449 Chronic obstructive pulmonary disease, unspecified: Secondary | ICD-10-CM | POA: Diagnosis not present

## 2023-07-11 DIAGNOSIS — Z882 Allergy status to sulfonamides status: Secondary | ICD-10-CM | POA: Diagnosis not present

## 2023-07-11 DIAGNOSIS — S2231XA Fracture of one rib, right side, initial encounter for closed fracture: Secondary | ICD-10-CM | POA: Diagnosis not present

## 2023-07-11 DIAGNOSIS — Z7982 Long term (current) use of aspirin: Secondary | ICD-10-CM | POA: Diagnosis not present

## 2023-07-11 DIAGNOSIS — Z79899 Other long term (current) drug therapy: Secondary | ICD-10-CM | POA: Diagnosis not present

## 2023-07-11 DIAGNOSIS — Z885 Allergy status to narcotic agent status: Secondary | ICD-10-CM | POA: Diagnosis not present

## 2023-07-11 DIAGNOSIS — I10 Essential (primary) hypertension: Secondary | ICD-10-CM | POA: Diagnosis not present

## 2023-07-11 DIAGNOSIS — R109 Unspecified abdominal pain: Secondary | ICD-10-CM | POA: Diagnosis not present

## 2023-07-11 DIAGNOSIS — R079 Chest pain, unspecified: Secondary | ICD-10-CM | POA: Diagnosis not present

## 2023-07-11 DIAGNOSIS — R64 Cachexia: Secondary | ICD-10-CM | POA: Diagnosis not present

## 2023-07-11 DIAGNOSIS — R0789 Other chest pain: Secondary | ICD-10-CM | POA: Diagnosis not present

## 2023-07-11 DIAGNOSIS — S20211A Contusion of right front wall of thorax, initial encounter: Secondary | ICD-10-CM | POA: Diagnosis not present

## 2023-07-11 DIAGNOSIS — Z880 Allergy status to penicillin: Secondary | ICD-10-CM | POA: Diagnosis not present

## 2023-07-21 NOTE — Progress Notes (Deleted)
Lindsey Morton, female    DOB: [birth year]    MRN: [id number]   Brief patient profile:  83  yowf  asthma as child on prn inhalers and noted doe initially by the time d/c smoking 2020 needed  24 h 02  referred to pulmonary clinic in [place]  04/27/2023 by Dr Felecia Shelling  for COPD eval / no pfts on file in EPIC      History of Present Illness  04/27/2023  Pulmonary/ 1st office eval/ Izadora Roehr / [facility]  Chief Complaint  Patient presents with   COPD  Dyspnea:  mb 200 ft slt uphill but avoids walking there due to doe off 02  / rides buggy at grocery store s 02  Cough: none  Sleep:flat bed/ 2 pillows no resp cc  Saba q am and prn daytime 02: 2 lpm NP hs / prn daytime  Lung cancer screen: referred7/22/2024  Rec Plan A = Automatic = Always=    trlegy 100 one click 1st thin in am  Plan B = Backup (to supplement plan A, not to replace it) Only use your albuterol inhaler as a rescue medication  Plan C = Crisis (instead of Plan B but only if Plan B stops working) - only use your albuterol nebulizer if you first try Plan B  Make sure you check your oxygen saturation  AT  your highest level of activity (not after you stop)   to be sure it stays over 90%  Also  Ok to try albuterol 15 min before an activity (on alternating days)  that you know would usually make you short of breath  My office will be contacting you by phone for referral to lung cancer screening program  > no LDSCT as of 07/22/2023   Please remember to go to the lab department   > did not go to lab       Please schedule a follow up office visit in 6 weeks, call sooner if needed with all medications /inhalers/ solutions in hand       07/22/2023  f/u ov/[place] office/Porscha Axley re: *** maint on *** did *** bring meds  No chief complaint on file.   Dyspnea:  *** Cough: *** Sleeping: ***   resp cc  SABA use: *** 02: ***  Lung cancer screening: ***   No obvious day to day or daytime variability or assoc excess/  purulent sputum or mucus plugs or hemoptysis or cp or chest tightness, subjective wheeze or overt sinus or hb symptoms.    Also denies any obvious fluctuation of symptoms with weather or environmental changes or other aggravating or alleviating factors except as outlined above   No unusual exposure hx or h/o childhood pna/ asthma or knowledge of premature birth.  Current Allergies, Complete Past Medical History, Past Surgical History, Family History, and Social History were reviewed in [employer] record.  ROS  The following are not active complaints unless bolded Hoarseness, sore throat, dysphagia, dental problems, itching, sneezing,  nasal congestion or discharge of excess mucus or purulent secretions, ear ache,   fever, chills, sweats, unintended wt loss or wt gain, classically pleuritic or exertional cp,  orthopnea pnd or arm/hand swelling  or leg swelling, presyncope, palpitations, abdominal pain, anorexia, nausea, vomiting, diarrhea  or change in bowel habits or change in bladder habits, change in stools or change in urine, dysuria, hematuria,  rash, arthralgias, visual complaints, headache, numbness, weakness or ataxia or problems with walking or coordination,  change  in mood or  memory.        Current Meds  Medication Sig   mirtazapine (REMERON) 7.5 MG tablet Take 7.5 mg by mouth at bedtime.           Past Medical History:  Diagnosis Date   Arthritis    Asthma    COPD (chronic obstructive pulmonary disease) (HCC)    DDD (degenerative disc disease)    DDD (degenerative disc disease)    Depression    Emphysema    Osteoarthritis    Osteoporosis    Panic attack       Objective:      Wt Readings from Last 3 Encounters:  04/27/23 92 lb (41.7 kg)  06/02/16 101 lb (45.8 kg)  04/12/15 100 lb (45.4 kg)      Vital signs reviewed  07/22/2023  - Note at rest 02 sats  ***% on ***   General appearance:    ***      Mild barre***      Assessment

## 2023-07-22 ENCOUNTER — Ambulatory Visit: Payer: Medicare HMO | Admitting: Internal Medicine

## 2023-07-22 ENCOUNTER — Encounter: Payer: Self-pay | Admitting: Internal Medicine

## 2023-11-04 ENCOUNTER — Telehealth: Payer: Self-pay

## 2023-11-04 DIAGNOSIS — Z122 Encounter for screening for malignant neoplasm of respiratory organs: Secondary | ICD-10-CM

## 2023-11-04 DIAGNOSIS — Z87891 Personal history of nicotine dependence: Secondary | ICD-10-CM

## 2023-11-04 NOTE — Telephone Encounter (Signed)
.  Lung Cancer Screening Narrative/Criteria Questionnaire (Cigarette Smokers Only- No Cigars/Pipes/vapes)   Lindsey Morton   SDMV:11/16/2023 @ 10:00am with Marcelline Mates, RN   [birth year]   LDCT: 11/19/2023 at 1:30pm at [facility]    68 y.o.   Phone: [phone]  Lung Screening Narrative (confirm age 59-77 yrs Medicare / 50-80 yrs Private pay insurance)   Insurance information:[employer]   Referring Provider:Wert   This screening involves an initial phone call with a team member from our program. It is called a shared decision making visit. The initial meeting is required by  insurance and Medicare to make sure you understand the program. This appointment takes about 15-20 minutes to complete. You will complete the screening scan at your scheduled date/time.  This scan takes about 5-10 minutes to complete. You can eat and drink normally before and after the scan.  Criteria questions for Lung Cancer Screening:   Are you a current or former smoker? Former Age began smoking: 23   If you are a former smoker, what year did you quit smoking? 2020 and quit additional 4 years (within 15 yrs)   To calculate your smoking history, I need an accurate estimate of how many packs of cigarettes you smoked per day and for how many years. (Not just the number of PPD you are now smoking)   Years smoking 29 x Packs per day 1 = Pack years 29   (at least 20 pack yrs)   (Make sure they understand that we need to know how much they have smoked in the past, not just the number of PPD they are smoking now)  Do you have a personal history of cancer?  No    Do you have a family history of cancer? No  Are you coughing up blood?  No  Have you had unexplained weight loss of 15 lbs or more in the last 6 months? No  It looks like you meet all criteria.  When would be a good time for [place] to schedule you for this screening?   Additional information:

## 2023-11-19 ENCOUNTER — Ambulatory Visit (HOSPITAL_COMMUNITY): Payer: Medicare HMO

## 2024-07-05 ENCOUNTER — Telehealth: Payer: Self-pay | Admitting: Internal Medicine

## 2024-07-05 ENCOUNTER — Other Ambulatory Visit: Payer: Self-pay | Admitting: *Deleted

## 2024-07-05 MED ORDER — ALBUTEROL SULFATE (2.5 MG/3ML) 0.083% IN NEBU: 2.5000 mg | INHALATION_SOLUTION | Freq: Four times a day (QID) | RESPIRATORY_TRACT | 0 refills | Status: DC | PRN

## 2024-07-05 MED ORDER — TRELEGY ELLIPTA 100-62.5-25 MCG/ACT IN AEPB: 1.0000 | INHALATION_SPRAY | Freq: Every day | RESPIRATORY_TRACT | 0 refills | Status: DC

## 2024-07-05 MED ORDER — ALBUTEROL SULFATE HFA 108 (90 BASE) MCG/ACT IN AERS: 2.0000 | INHALATION_SPRAY | Freq: Four times a day (QID) | RESPIRATORY_TRACT | 0 refills | Status: DC | PRN

## 2024-07-05 NOTE — Telephone Encounter (Signed)
 Patient has schedule f/u in [place] office with Dr. Darlean on 08/10/24.  Courtesy refill sent to [facility].  Message sent to pharmacy team to see what is on formulary as the cost for Trelegy is over $400.  ATC patient x1 to make her aware.  No answer.  No VM set up.  Pharmacy, please advise on what is on formulary for patient as Trelegy is over $400 according to the pop up box that came up when refilling med.  Thank you.

## 2024-07-05 NOTE — Telephone Encounter (Unsigned)
 Copied from CRM #[id number]. Topic: Clinical - Medication Refill >> Jul 05, 2024  2:16 PM Rozanna G wrote: Medication: albuterol  (PROVENTIL ) (2.5 MG/3ML) 0.083% nebulizer solution  Has the patient contacted their pharmacy? Yes (Agent: If no, request that the patient contact the pharmacy for the refill. If patient does not wish to contact the pharmacy document the reason why and proceed with request.) (Agent: If yes, when and what did the pharmacy advise?)  This is the patient's preferred pharmacy:  [facility] - [place], [place] - [address] [place] [address] Phone: [phone] Fax: [phone]  Is this the correct pharmacy for this prescription? Yes If no, delete pharmacy and type the correct one.   Has the prescription been filled recently? No  Is the patient out of the medication? Yes  Has the patient been seen for an appointment in the last year OR does the patient have an upcoming appointment? Yes  Can we respond through MyChart? No  Agent: Please be advised that Rx refills may take up to 3 business days. We ask that you follow-up with your pharmacy.

## 2024-07-06 ENCOUNTER — Other Ambulatory Visit (HOSPITAL_COMMUNITY): Payer: Self-pay

## 2024-07-07 NOTE — Telephone Encounter (Signed)
 Called and spoke with patient, advised her of information per pharmacy team.  Advised her to call her insurance company to find out what her deductible is and how much she has left to meet.  She has not been taking the Trelegy d/t the cost.  She does have albuterol  neb solution and inhaler for sob.  I advised her to use that until her appointment in November and during that appointment we will have to decide what to do about a maintenance inhaler based on her deductible.  She verbalized understanding.  Nothing further needed.

## 2024-08-01 ENCOUNTER — Other Ambulatory Visit: Payer: Self-pay | Admitting: Internal Medicine

## 2024-08-01 ENCOUNTER — Ambulatory Visit: Payer: Self-pay | Admitting: Internal Medicine

## 2024-08-01 MED ORDER — ALBUTEROL SULFATE (2.5 MG/3ML) 0.083% IN NEBU: 2.5000 mg | INHALATION_SOLUTION | Freq: Four times a day (QID) | RESPIRATORY_TRACT | 0 refills | Status: DC | PRN

## 2024-08-01 MED ORDER — ALBUTEROL SULFATE HFA 108 (90 BASE) MCG/ACT IN AERS: 2.0000 | INHALATION_SPRAY | Freq: Four times a day (QID) | RESPIRATORY_TRACT | 0 refills | Status: DC | PRN

## 2024-08-01 MED ORDER — TRELEGY ELLIPTA 100-62.5-25 MCG/ACT IN AEPB: 1.0000 | INHALATION_SPRAY | Freq: Every day | RESPIRATORY_TRACT | 0 refills | Status: AC

## 2024-08-01 NOTE — Telephone Encounter (Signed)
 FYI Only or Action Required?: FYI only for provider.  Patient is followed in Pulmonology for COPD, last seen on 04/27/2023 by Lindsey Ozell NOVAK, MD.  Called Nurse Triage reporting Shortness of Breath and Anxiety.  Symptoms began a year ago.  Interventions attempted: Rescue inhaler, Maintenance inhaler, Nebulizer treatments, and Home oxygen  use.  Symptoms are: gradually worsening.  Triage Disposition: See PCP When Office is Open (Within 3 Days)  Patient/caregiver understands and will follow disposition?: Yes                           Copied from CRM #[id number]. Topic: Clinical - Red Word Triage >> Aug 01, 2024  3:00 PM Lindsey Morton wrote: [facility] that prompted transfer to Nurse Triage:   Pt is experiencing SOB that triggers panic attacks Having panic attacks 3 to 4 times a week Has to sit down even after short distances to catch her breath.  Is having high levels of anxiety due to medical conditions.   Pt of Dr. Darlean Reason for Disposition  MODERATE anxiety (e.g., persistent or frequent anxiety symptoms; interferes with sleep, school, or work)  Answer Assessment - Initial Assessment Questions E2C2 Pulmonary Triage - Initial Assessment Questions  1. Chief Complaint (e.g., cough, sob, wheezing, fever, chills, sweat or additional symptoms) *Go to specific symptom protocol after initial questions. Increasing anxiety causing worsening SOB   2. How long have symptoms been present? This RN prompted patient, patient unable to give a direct answer of when symptoms worsened  3. Have you used any OTC meds to help with symptoms? If yes, ask What medications? N/A   4. Have you used your inhalers/maintenance medication? If yes, What medications? Trelegy and albuterol  1-2 x per day   5. Do you wear supplemental oxygen ? If yes, How many liters are you supposed to use? Yes, 2L   6. Do you monitor your oxygen  levels? If yes, What is your reading (oxygen   level) today? 93% while on phone with this RN   7. What is your usual oxygen  saturation reading?  (Note: Pulmonary O2 sats should be 90% or greater) 94%     1. CONCERN: Did anything happen that prompted you to call today?      Panic attacks increasing in frequency, panic attacks trigger SOB 2. ANXIETY SYMPTOMS: Can you describe how you (your loved one; patient) have been feeling? (e.g., tense, restless, panicky, anxious, keyed up, overwhelmed, sense of impending doom).      Tearful, panicky  3. ONSET: How long have you been feeling this way? (e.g., hours, days, weeks)     This RN prompted patient, patient unable to give a direct answer 4. SEVERITY: How would you rate the level of anxiety? (e.g., 0 - 10; or mild, moderate, severe).     Moderate 5. FUNCTIONAL IMPAIRMENT: How have these feelings affected your ability to do daily activities? Have you had more difficulty than usual doing your normal daily activities? (e.g., getting better, same, worse; self-care, school, work, interactions)     States she is still able to do daily activities, but has to take a little more time to do them 6. HISTORY: Have you felt this way before? Have you ever been diagnosed with an anxiety problem in the past? (e.g., generalized anxiety disorder, panic attacks, PTSD). If Yes, ask: How was this problem treated? (e.g., medicines, counseling, etc.)     Denies  7. RISK OF HARM - SUICIDAL IDEATION: Do you ever  have thoughts of hurting or killing yourself? If Yes, ask:  Do you have these feelings now? Do you have a plan on how you would do this?     Denies 8. TREATMENT:  What has been done so far to treat this anxiety? (e.g., medicines, relaxation strategies). What has helped?     Denies 9. THERAPIST: Do you have a counselor or therapist? If Yes, ask: What is their name?     N/A 10. POTENTIAL TRIGGERS: Do you drink caffeinated beverages (e.g., coffee, colas, teas), and how much  daily? Do you drink alcohol  or use any drugs? Have you started any new medicines recently?     Health issues and family issues  36. PATIENT SUPPORT: Who is with you now? Who do you live with? Do you have family or friends who you can talk to?      States she lives with her husband and other family members, states she feels safe at home 53. OTHER SYMPTOMS: Do you have any other symptoms? (e.g., feeling depressed, trouble concentrating, trouble sleeping, trouble breathing, palpitations or fast heartbeat, chest pain, sweating, nausea, or diarrhea)     Difficulty sleeping States she gets into frequent arguments with her son, this causes panic, which causes SOB Patient able to speak in clear and complete sentences while on phone with this RN Runny nose for 6-8 months 13. PREGNANCY: Is there any chance you are pregnant? When was your last menstrual period?     N/A    This RN scheduled patient with Dr. Darlean at [facility] office location. Patient is aware appointment will be at [facility] office location.  Protocols used: Anxiety and Panic Attack-A-AH

## 2024-08-01 NOTE — Telephone Encounter (Signed)
 Copied from CRM [id number]. Topic: Clinical - Medication Refill >> Aug 01, 2024  2:57 PM Russell PARAS wrote: Medication:   albuterol  (PROVENTIL ) (2.5 MG/3ML) 0.083% nebulizer solution  albuterol  (VENTOLIN  HFA) 108 (90 Base) MCG/ACT inhaler  Fluticasone-Umeclidin-Vilant (TRELEGY ELLIPTA ) 100-62.5-25 MCG/ACT AEPB   Has the patient contacted their pharmacy? Yes (Agent: If no, request that the patient contact the pharmacy for the refill. If patient does not wish to contact the pharmacy document the reason why and proceed with request.) (Agent: If yes, when and what did the pharmacy advise?)  This is the patient's preferred pharmacy:  [facility] - [place], [place] - [address] [place] [address] Phone: [phone] Fax: [phone]  Is this the correct pharmacy for this prescription? Yes If no, delete pharmacy and type the correct one.   Has the prescription been filled recently? Yes  Is the patient out of the medication? Yes  Has the patient been seen for an appointment in the last year OR does the patient have an upcoming appointment? No, last visit 04/27/2023  Can we respond through MyChart? Yes  Agent: Please be advised that Rx refills may take up to 3 business days. We ask that you follow-up with your pharmacy.

## 2024-08-02 ENCOUNTER — Encounter: Payer: Self-pay | Admitting: Internal Medicine

## 2024-08-02 ENCOUNTER — Ambulatory Visit: Admitting: Internal Medicine

## 2024-08-02 NOTE — Progress Notes (Deleted)
 Lindsey Morton, female    DOB: 1956/08/28    MRN: [id number]   Brief patient profile:  54  yowf  asthma as child on prn inhalers and noted doe initially by the time d/c smoking 2020 needed  24 h 02  referred to pulmonary clinic in [place]  04/27/2023 by Dr Carlette  for COPD eval / no pfts on file in EPIC      History of Present Illness  04/27/2023  Pulmonary/ 1st office eval/ [facility] / [place] [facility]  Chief Complaint  Patient presents with   COPD  Dyspnea:  mb 200 ft slt uphill but avoids walking there due to doe off 02  / rides buggy at grocery store s 02  Cough: none  Sleep:flat bed/ 2 pillows no resp cc  Saba q am and prn daytime 02: 2 lpm NP hs / prn daytime  Lung cancer screen: referred7/22/2024  Rec Plan A = Automatic = Always=    trlegy 100 one click 1st thin in am  Plan B = Backup (to supplement plan A, not to replace it) Only use your albuterol  inhaler as a rescue medication  Plan C = Crisis (instead of Plan B but only if Plan B stops working) - only use your albuterol  nebulizer if you first try Plan B Make sure you check your oxygen  saturation  AT  your highest level of activity (not after you stop)   to be sure it stays over 90% Also  Ok to try albuterol  15 min before an activity (on alternating days)  that you know would usually make you short of breat My office will be contacting you by phone for referral to lung cancer screening program  > not done   Please remember to go to the lab department   > not done  Please schedule a follow up office visit in 6 weeks, call sooner if needed with all medications /inhalers/ solutions in hand > not done   08/02/2024 ACUTE ov/Nashla Althoff re: ***   maint on ***  No chief complaint on file.   Dyspnea:  *** Cough: *** Sleeping: *** resp cc  SABA use: *** 02: ***  Lung cancer screening :  ***    No obvious day to day or daytime variability or assoc excess/ purulent sputum or mucus plugs or hemoptysis or cp or chest tightness,  subjective wheeze or overt sinus or hb symptoms.    Also denies any obvious fluctuation of symptoms with weather or environmental changes or other aggravating or alleviating factors except as outlined above   No unusual exposure hx or h/o childhood pna/ asthma or knowledge of premature birth.  Current Allergies, Complete Past Medical History, Past Surgical History, Family History, and Social History were reviewed in [employer] record.  ROS  The following are not active complaints unless bolded Hoarseness, sore throat, dysphagia, dental problems, itching, sneezing,  nasal congestion or discharge of excess mucus or purulent secretions, ear ache,   fever, chills, sweats, unintended wt loss or wt gain, classically pleuritic or exertional cp,  orthopnea pnd or arm/hand swelling  or leg swelling, presyncope, palpitations, abdominal pain, anorexia, nausea, vomiting, diarrhea  or change in bowel habits or change in bladder habits, change in stools or change in urine, dysuria, hematuria,  rash, arthralgias, visual complaints, headache, numbness, weakness or ataxia or problems with walking or coordination,  change in mood or  memory.        No outpatient medications have been marked  as taking for the 08/02/24 encounter (Appointment) with Tangie Stay B, MD.          Past Medical History:  Diagnosis Date   Arthritis    Asthma    COPD (chronic obstructive pulmonary disease) (HCC)    DDD (degenerative disc disease)    DDD (degenerative disc disease)    Depression    Emphysema    Osteoarthritis    Osteoporosis    Panic attack       Objective:      Wt Readings from Last 3 Encounters:  04/27/23 92 lb (41.7 kg)  06/02/16 101 lb (45.8 kg)  04/12/15 100 lb (45.4 kg)    Vital signs reviewed  08/02/2024  - Note at rest 02 sats  ***% on ***   General appearance:    ***     Mild barr ***      Assessment

## 2024-08-03 ENCOUNTER — Ambulatory Visit: Admitting: Internal Medicine

## 2024-08-03 NOTE — Progress Notes (Deleted)
 Lindsey Morton, female    DOB: 1956-10-02    MRN: [id number]   Brief patient profile:  49  yowf  asthma as child on prn inhalers and noted doe initially by the time d/c smoking 2020 needed  24 h 02  referred to pulmonary clinic in [place]  04/27/2023 by Dr Carlette  for COPD eval / no pfts on file in EPIC      History of Present Illness  04/27/2023  Pulmonary/ 1st Morton eval/ Lindsey Morton / Lindsey Morton  Chief Complaint  Patient presents with   COPD  Dyspnea:  mb 200 ft slt uphill but avoids walking there due to doe off 02  / rides buggy at grocery store s 02  Cough: none  Sleep:flat bed/ 2 pillows no resp cc  Saba q am and prn daytime 02: 2 lpm NP hs / prn daytime  Lung cancer screen: referred7/22/2024  Rec Plan A = Automatic = Always=    trlegy 100 one click 1st thin in am  Plan B = Backup (to supplement plan A, not to replace it) Only use your albuterol  inhaler as a rescue medication  Plan C = Crisis (instead of Plan B but only if Plan B stops working) - only use your albuterol  nebulizer if you first try Plan B Make sure you check your oxygen  saturation  AT  your highest level of activity (not after you stop)   to be sure it stays over 90% Also  Ok to try albuterol  15 min before an activity (on alternating days)  that you know would usually make you short of breat My Morton will be contacting you by phone for referral to lung cancer screening program  > not done   Please remember to go to the lab department   > not done  Please schedule a follow up Morton visit in 6 weeks, call sooner if needed with all medications /inhalers/ solutions in hand > not done   08/03/2024 ACUTE ov/Lindsey Morton re: ***   maint on ***  No chief complaint on file.   Dyspnea:  *** Cough: *** Sleeping: *** resp cc  SABA use: *** 02: ***  Lung cancer screening :  ***    No obvious day to day or daytime variability or assoc excess/ purulent sputum or mucus plugs or hemoptysis or cp or chest tightness,  subjective wheeze or overt sinus or hb symptoms.    Also denies any obvious fluctuation of symptoms with weather or environmental changes or other aggravating or alleviating factors except as outlined above   No unusual exposure hx or h/o childhood pna/ asthma or knowledge of premature birth.  Current Allergies, Complete Past Medical History, Past Surgical History, Family History, and Social History were reviewed in [employer] record.  ROS  The following are not active complaints unless bolded Hoarseness, sore throat, dysphagia, dental problems, itching, sneezing,  nasal congestion or discharge of excess mucus or purulent secretions, ear ache,   fever, chills, sweats, unintended wt loss or wt gain, classically pleuritic or exertional cp,  orthopnea pnd or arm/hand swelling  or leg swelling, presyncope, palpitations, abdominal pain, anorexia, nausea, vomiting, diarrhea  or change in bowel habits or change in bladder habits, change in stools or change in urine, dysuria, hematuria,  rash, arthralgias, visual complaints, headache, numbness, weakness or ataxia or problems with walking or coordination,  change in mood or  memory.        No outpatient medications have been marked  as taking for the 08/03/24 encounter (Appointment) with Brahim Dolman B, MD.          Past Medical History:  Diagnosis Date   Arthritis    Asthma    COPD (chronic obstructive pulmonary disease) (HCC)    DDD (degenerative disc disease)    DDD (degenerative disc disease)    Depression    Emphysema    Osteoarthritis    Osteoporosis    Panic attack       Objective:      Wt Readings from Last 3 Encounters:  04/27/23 92 lb (41.7 kg)  06/02/16 101 lb (45.8 kg)  04/12/15 100 lb (45.4 kg)    Vital signs reviewed  08/03/2024  - Note at rest 02 sats  ***% on ***   General appearance:    ***     Mild barr ***      Assessment

## 2024-08-10 ENCOUNTER — Encounter: Payer: Self-pay | Admitting: Internal Medicine

## 2024-08-10 ENCOUNTER — Ambulatory Visit (INDEPENDENT_AMBULATORY_CARE_PROVIDER_SITE_OTHER): Admitting: Internal Medicine

## 2024-08-10 VITALS — BP 101/55 | HR 89 | Ht 67.0 in | Wt 90.0 lb

## 2024-08-10 DIAGNOSIS — J449 Chronic obstructive pulmonary disease, unspecified: Secondary | ICD-10-CM

## 2024-08-10 DIAGNOSIS — J9611 Chronic respiratory failure with hypoxia: Secondary | ICD-10-CM | POA: Diagnosis not present

## 2024-08-10 DIAGNOSIS — R0609 Other forms of dyspnea: Secondary | ICD-10-CM

## 2024-08-10 MED ORDER — TRELEGY ELLIPTA 100-62.5-25 MCG/ACT IN AEPB: INHALATION_SPRAY | RESPIRATORY_TRACT | 3 refills | Status: AC

## 2024-08-10 MED ORDER — MIRTAZAPINE 7.5 MG PO TABS: 7.5000 mg | ORAL_TABLET | Freq: Every day | ORAL | 2 refills | Status: AC

## 2024-08-10 MED ORDER — METHYLPREDNISOLONE ACETATE 80 MG/ML IJ SUSP
120.0000 mg | Freq: Once | INTRAMUSCULAR | Status: AC
  Administered 2024-08-10: 120 mg via INTRAMUSCULAR

## 2024-08-10 MED ORDER — TRELEGY ELLIPTA 100-62.5-25 MCG/ACT IN AEPB: 1.0000 | INHALATION_SPRAY | Freq: Every day | RESPIRATORY_TRACT | Status: AC

## 2024-08-10 MED ORDER — ALBUTEROL SULFATE (2.5 MG/3ML) 0.083% IN NEBU
INHALATION_SOLUTION | RESPIRATORY_TRACT | 0 refills | Status: AC
Start: 2024-08-10 — End: ?

## 2024-08-10 MED ORDER — ALBUTEROL SULFATE HFA 108 (90 BASE) MCG/ACT IN AERS: 2.0000 | INHALATION_SPRAY | RESPIRATORY_TRACT | 0 refills | Status: AC | PRN

## 2024-08-10 NOTE — Patient Instructions (Addendum)
 Plan A = Automatic = Always=    Trelegy 100 one click first thing each AM  and we will get this thru [facility] for free  and take Remeron 7.5 mg each evening after supper or before bedtime  Plan B = Backup (to supplement plan A, not to replace it) Use your albuterol  inhaler as a rescue medication to be used if you can't catch your breath by resting or slowing your pace  or doing a relaxed purse lip breathing pattern.  - The less you use it, the better it will work when you need it. - Ok to use the inhaler up to 2 puffs  every 4 hours if you must but call for appointment if use goes up over your usual need - Don't leave home without it !!  (think of it like the spare tire or starter fluid for your car)   Plan C = Crisis (instead of Plan B but only if Plan B stops working) - only use your albuterol  nebulizer if you first try Plan B and it fails to help > ok to use the nebulizer up to every 4 hours but if start needing it regularly call for immediate appointment   Depomedrol 120 mg IM   Please schedule a follow up visit in  6  months but call sooner if needed

## 2024-08-10 NOTE — Assessment & Plan Note (Addendum)
 GOLD ? Group E likely Stopped smoking around 2019/20 and already on 02 at that point  - 04/27/2023    give 6 weeks of trelegy 100 samples and apply to [employer] for more  - Allergy screen 08/10/2024   Eos 0.3 /  Alpha one AT phenotype MM level 182 - 08/10/2024  After extensive coaching inhaler device,  effectiveness =    75% with dpi/ elipta as insp force suboptimal > resume trelegy 100 and request for samples submitted    Group E in terms of symptoms/risk so  laba/lama/ICS  therefore appropriate rx at this point >>>  trelegy 100   and approp SABA prn.  Re SABA :  I spent extra time with pt today reviewing appropriate use of albuterol  for prn use on exertion with the following points: 1) saba is for relief of sob that does not improve by walking a slower pace or resting but rather if the pt does not improve after trying this first. 2) If the pt is convinced, as many are, that saba helps recover from activity faster then it's easy to tell if this is the case by re-challenging : ie stop, take the inhaler, then p 5 minutes try the exact same activity (intensity of workload) that just caused the symptoms and see if they are substantially diminished or not after saba 3) if there is an activity that reproducibly causes the symptoms, try the saba 15 min before the activity on alternate days   If in fact the saba really does help, then fine to continue to use it prn but advised may need to look closer at the maintenance regimen being used to achieve better control of airways disease with exertion.    >>> requested free suppy from [facility]  >>> depomedrol 120 mg IM for apparent acute bronchitic component today.

## 2024-08-10 NOTE — Progress Notes (Unsigned)
 Lindsey Morton, female    DOB: Sep 09, 1956    MRN: [id number]   Brief patient profile:  40  yowf  asthma as child on prn inhalers and noted doe initially by the time d/c smoking 2020/MM needed  24 h 02  referred to pulmonary clinic in [place]  04/27/2023 by Dr Carlette  for COPD eval / no pfts on file in EPIC 9    History of Present Illness  04/27/2023  Pulmonary/ 1st office eval/ [facility]  Chief Complaint  Patient presents with   COPD  Dyspnea:  mb 200 ft slt uphill but avoids walking there due to doe off 02  / rides buggy at grocery store s 02  Cough: none  Sleep:flat bed/ 2 pillows no resp cc  Saba q am and prn daytime 02: 2 lpm NP hs / prn daytime  Lung cancer screen: referred7/22/2024  Rec Plan A = Automatic = Always=    trlegy 100 one click 1st thin in am  Plan B = Backup (to supplement plan A, not to replace it) Only use your albuterol  inhaler as a rescue medication  Plan C = Crisis (instead of Plan B but only if Plan B stops working) - only use your albuterol  nebulizer if you first try Plan B Make sure you check your oxygen  saturation  AT  your highest level of activity (not after you stop)   to be sure it stays over 90% Also  Ok to try albuterol  15 min before an activity (on alternating days)  that you know would usually make you short of breat My office will be contacting you by phone for referral to lung cancer screening program  > not done  Please remember to go to the lab department   > not done  Please schedule a follow up office visit in 6 weeks, call sooner if needed with all medications /inhalers/ solutions in hand > not done   08/10/2024 ACUTE ov/[facility] re: presumed copd  maint on nothing   labs and LDSCT not done last ov  - breathing worse off trelegy  Chief Complaint  Patient presents with   COPD    F/u   Dyspnea:  mostly housebound  Cough: smoker's rattle  Sleeping: poorly off remeron    SABA use: out of it  02:  2lpm 24/7  LDSCT ordered  but not yet done > reminded to call to rescedule / notified [facility] by staff message to call her      No obvious day to day or daytime variability or assoc excess/ purulent sputum or mucus plugs or hemoptysis or cp or chest tightness, subjective wheeze or overt sinus or hb symptoms.    Also denies any obvious fluctuation of symptoms with weather or environmental changes or other aggravating or alleviating factors except as outlined above   No unusual exposure hx or h/o childhood pna or knowledge of premature birth.  Current Allergies, Complete Past Medical History, Past Surgical History, Family History, and Social History were reviewed in [employer] record.  ROS  The following are not active complaints unless bolded Hoarseness, sore throat, dysphagia, dental problems, itching, sneezing,  nasal congestion or discharge of excess mucus or purulent secretions, ear ache,   fever, chills, sweats, unintended wt loss or wt gain, classically pleuritic or exertional cp,  orthopnea pnd or arm/hand swelling  or leg swelling, presyncope, palpitations, abdominal pain, anorexia, nausea, vomiting, diarrhea  or change in bowel habits or change  in bladder habits, change in stools or change in urine, dysuria, hematuria,  rash, arthralgias, visual complaints, headache, numbness, weakness or ataxia or problems with walking or coordination,  change in mood or  memory.         Out of all meds x months      Past Medical History:  Diagnosis Date   Arthritis    Asthma    COPD (chronic obstructive pulmonary disease) (HCC)    DDD (degenerative disc disease)    DDD (degenerative disc disease)    Depression    Emphysema    Osteoarthritis    Osteoporosis    Panic attack       Objective:      Wt Readings from Last 3 Encounters:  08/10/24 90 lb (40.8 kg)  04/27/23 92 lb (41.7 kg)  06/02/16 101 lb (45.8 kg)    Vital signs reviewed  08/10/2024  - Note at rest 02 sats  88% on 2lpm     General appearance:   very frail easily confused amb thin wf / congested cough    HEENT : Oropharynx  clear      NECK :  without  apparent JVD/ palpable Nodes/TM    LUNGS: no acc muscle use,  Mild barrel  contour chest wall with bilateral  exp rhonchi  and  without cough on insp or exp maneuvers  and mild  Hyperresonant  to  percussion bilaterally     CV:  RRR  no s3 or murmur or increase in P2, and no edema   ABD:  soft and nontender   MS:  Nl gait/ ext warm without deformities Or obvious joint restrictions  calf tenderness, cyanosis or clubbing     SKIN: warm and dry without lesions    NEURO:  alert, approp, nl sensorium with  no motor or cerebellar deficits apparent.    Labs ordered/ reviewed:      Chemistry      Component Value Date/Time   NA 145 (H) 08/10/2024 1150   K 3.8 08/10/2024 1150   CL 96 08/10/2024 1150   CO2 34 (H) 08/10/2024 1150   BUN 12 08/10/2024 1150   CREATININE 0.64 08/10/2024 1150      Component Value Date/Time   CALCIUM 9.8 08/10/2024 1150   ALKPHOS 72 05/25/2016 1200   AST 45 (H) 05/25/2016 1200   ALT 25 05/25/2016 1200   BILITOT 0.5 05/25/2016 1200        Lab Results  Component Value Date   WBC 11.1 (H) 08/10/2024   HGB 13.3 08/10/2024   HCT 41.9 08/10/2024   MCV 97 08/10/2024   PLT 314 08/10/2024     Lab Results  Component Value Date   DDIMER 0.60 (H) 06/02/2016      Lab Results  Component Value Date   TSH 1.72 09/22/2011             Assessment   Assessment & Plan COPD mixed type (HCC) GOLD ? Group E likely Stopped smoking around 2019/20 and already on 02 at that point  - 04/27/2023    give 6 weeks of trelegy 100 samples and apply to [facility] for more  - Allergy screen 08/10/2024   Eos 0.3 /  Alpha one AT phenotype MM level 182 - 08/10/2024  After extensive coaching inhaler device,  effectiveness =    75% with dpi/ elipta as insp force suboptimal > resume trelegy 100 and request for samples submitted    Group E in  terms of symptoms/risk so  laba/lama/ICS  therefore appropriate rx at this point >>>  trelegy 100   and approp SABA prn.  Re SABA :  I spent extra time with pt today reviewing appropriate use of albuterol  for prn use on exertion with the following points: 1) saba is for relief of sob that does not improve by walking a slower pace or resting but rather if the pt does not improve after trying this first. 2) If the pt is convinced, as many are, that saba helps recover from activity faster then it's easy to tell if this is the case by re-challenging : ie stop, take the inhaler, then p 5 minutes try the exact same activity (intensity of workload) that just caused the symptoms and see if they are substantially diminished or not after saba 3) if there is an activity that reproducibly causes the symptoms, try the saba 15 min before the activity on alternate days   If in fact the saba really does help, then fine to continue to use it prn but advised may need to look closer at the maintenance regimen being used to achieve better control of airways disease with exertion.    >>> requested free suppy from [facility]  >>> depomedrol 120 mg IM for apparent acute bronchitic component today.     Chronic respiratory failure with hypoxia (HCC) Placed on 02 ? 2019/20 about the time quit smoking  - HCO3  34 off inhalers 08/10/2024   Borderline sats on 2lpm today but not on any of her inhalers so   >>> resume maint and prns for copd as above  >>> monitor sats with goal > 90% at times and ok to titrate up 02 flow for this target if needed esp with activity - see avs for instructions unique to this ov         Each maintenance medication was reviewed in detail including emphasizing most importantly the difference between maintenance and prns and under what circumstances the prns are to be triggered using an action plan format where appropriate.  Total time for H and P, chart review, counseling, reviewing dpi/elipta  and 02/ pulse ox/ hfa/ neb device(s) and generating customized AVS unique to this office visit / same day charting = 42 min        AVS  Patient Instructions  Plan A = Automatic = Always=    Trelegy 100 one click first thing each AM  and we will get this thru [facility] for free  and take Remeron 7.5 mg each evening after supper or before bedtime  Plan B = Backup (to supplement plan A, not to replace it) Use your albuterol  inhaler as a rescue medication to be used if you can't catch your breath by resting or slowing your pace  or doing a relaxed purse lip breathing pattern.  - The less you use it, the better it will work when you need it. - Ok to use the inhaler up to 2 puffs  every 4 hours if you must but call for appointment if use goes up over your usual need - Don't leave home without it !!  (think of it like the spare tire or starter fluid for your car)   Plan C = Crisis (instead of Plan B but only if Plan B stops working) - only use your albuterol  nebulizer if you first try Plan B and it fails to help > ok to use the nebulizer up to every 4 hours but if  start needing it regularly call for immediate appointment   Depomedrol 120 mg IM   Please schedule a follow up visit in  6  months but call sooner if needed      Ozell America, MD 08/10/2024

## 2024-08-10 NOTE — Assessment & Plan Note (Addendum)
 Placed on 02 ? 2019/20 about the time quit smoking  - HCO3  34 off inhalers 08/10/2024   Borderline sats on 2lpm today but not on any of her inhalers so   >>> resume maint and prns for copd as above  >>> monitor sats with goal > 90% at times and ok to titrate up 02 flow for this target if needed esp with activity - see avs for instructions unique to this ov         Each maintenance medication was reviewed in detail including emphasizing most importantly the difference between maintenance and prns and under what circumstances the prns are to be triggered using an action plan format where appropriate.  Total time for H and P, chart review, counseling, reviewing dpi/elipta and 02/ pulse ox/ hfa/ neb device(s) and generating customized AVS unique to this office visit / same day charting = 42 min

## 2024-08-12 ENCOUNTER — Ambulatory Visit: Payer: Self-pay | Admitting: Internal Medicine

## 2024-08-12 LAB — BASIC METABOLIC PANEL WITH GFR
BUN/Creatinine Ratio: 19 (ref 12–28)
BUN: 12 mg/dL (ref 8–27)
CO2: 34 mmol/L — ABNORMAL HIGH (ref 20–29)
Calcium: 9.8 mg/dL (ref 8.7–10.3)
Chloride: 96 mmol/L (ref 96–106)
Creatinine, Ser: 0.64 mg/dL (ref 0.57–1.00)
Glucose: 93 mg/dL (ref 70–99)
Potassium: 3.8 mmol/L (ref 3.5–5.2)
Sodium: 145 mmol/L — ABNORMAL HIGH (ref 134–144)
eGFR: 96 mL/min/1.73 (ref >59)

## 2024-08-12 LAB — CBC WITH DIFFERENTIAL/PLATELET
Basophils Absolute: 0.1 x10E3/uL (ref 0.0–0.2)
Basos: 1 %
EOS (ABSOLUTE): 0.3 x10E3/uL (ref 0.0–0.4)
Eos: 3 %
Hematocrit: 41.9 % (ref 34.0–46.6)
Hemoglobin: 13.3 g/dL (ref 11.1–15.9)
Immature Grans (Abs): 0 x10E3/uL (ref 0.0–0.1)
Immature Granulocytes: 0 %
Lymphocytes Absolute: 2 x10E3/uL (ref 0.7–3.1)
Lymphs: 18 %
MCH: 30.8 pg (ref 26.6–33.0)
MCHC: 31.7 g/dL (ref 31.5–35.7)
MCV: 97 fL (ref 79–97)
Monocytes Absolute: 1 x10E3/uL — ABNORMAL HIGH (ref 0.1–0.9)
Monocytes: 9 %
Neutrophils Absolute: 7.8 x10E3/uL — ABNORMAL HIGH (ref 1.4–7.0)
Neutrophils: 69 %
Platelets: 314 x10E3/uL (ref 150–450)
RBC: 4.32 x10E6/uL (ref 3.77–5.28)
RDW: 11.4 % — ABNORMAL LOW (ref 11.7–15.4)
WBC: 11.1 x10E3/uL — ABNORMAL HIGH (ref 3.4–10.8)

## 2024-08-12 LAB — ALPHA-1-ANTITRYPSIN PHENOTYP: A-1 Antitrypsin: 182 mg/dL (ref 101–187)

## 2024-08-12 NOTE — Progress Notes (Signed)
 Called and relayed results to pt and pt confirmed understanding

## 2024-08-24 ENCOUNTER — Telehealth: Payer: Self-pay | Admitting: *Deleted

## 2024-08-24 NOTE — Telephone Encounter (Signed)
 Attempted to contact pt regarding lung cancer screening referral but voicemail is not set up so unable to leave a message for call back.   Other attempts made were as follows and can be found in the ambulatory referral to lung cancer screening.  07/10/23- Attempted to call but no voicemail. Letter mailed to pt.  11/04/23- Pt was scheduled for 11/16/23 11/16/23- Pt was no show for this appt- Letter mailed to pt to call to reschedule  11/23/23- Attempted to call but no voicemail Referral closed due to unsuccessful attempts to schedule.

## 2024-08-26 ENCOUNTER — Encounter: Payer: Self-pay | Admitting: *Deleted
